# Patient Record
Sex: Female | Born: 1970 | Race: White | Hispanic: No | Smoking: Former smoker
Health system: Southern US, Community
[De-identification: ages and names within clinical notes are randomized; demographics above are authoritative.]

## PROBLEM LIST (undated history)

## (undated) ENCOUNTER — Ambulatory Visit: Admission: EM | Payer: Medicaid Other | Source: Home / Self Care

## (undated) DIAGNOSIS — I10 Essential (primary) hypertension: Secondary | ICD-10-CM

## (undated) DIAGNOSIS — G629 Polyneuropathy, unspecified: Secondary | ICD-10-CM

## (undated) HISTORY — PX: DILATION AND CURETTAGE OF UTERUS: SHX78

## (undated) HISTORY — PX: TONSILLECTOMY AND ADENOIDECTOMY: SUR1326

---

## 2007-08-03 ENCOUNTER — Emergency Department: Payer: Self-pay | Admitting: Internal Medicine

## 2007-10-06 IMAGING — CR DG CHEST 2V
1 series · 3 of 3 positions shown · non-contrast
Comparison: none

REASON FOR EXAM: [DATE]--smoker
COMMENTS:

PROCEDURE:     DXR - DXR CHEST PA (OR AP) AND LATERAL  - [DATE]  [DATE]
RESULT:     The lung fields are clear. No pneumonia, pneumothorax or pleural
effusion is seen. The heart size is normal. The mediastinal and osseous
structures show no significant abnormalities.

[Series 1: view not recorded · 0.17mm/px · 3 of 3 slices shown]
[im 1/3]
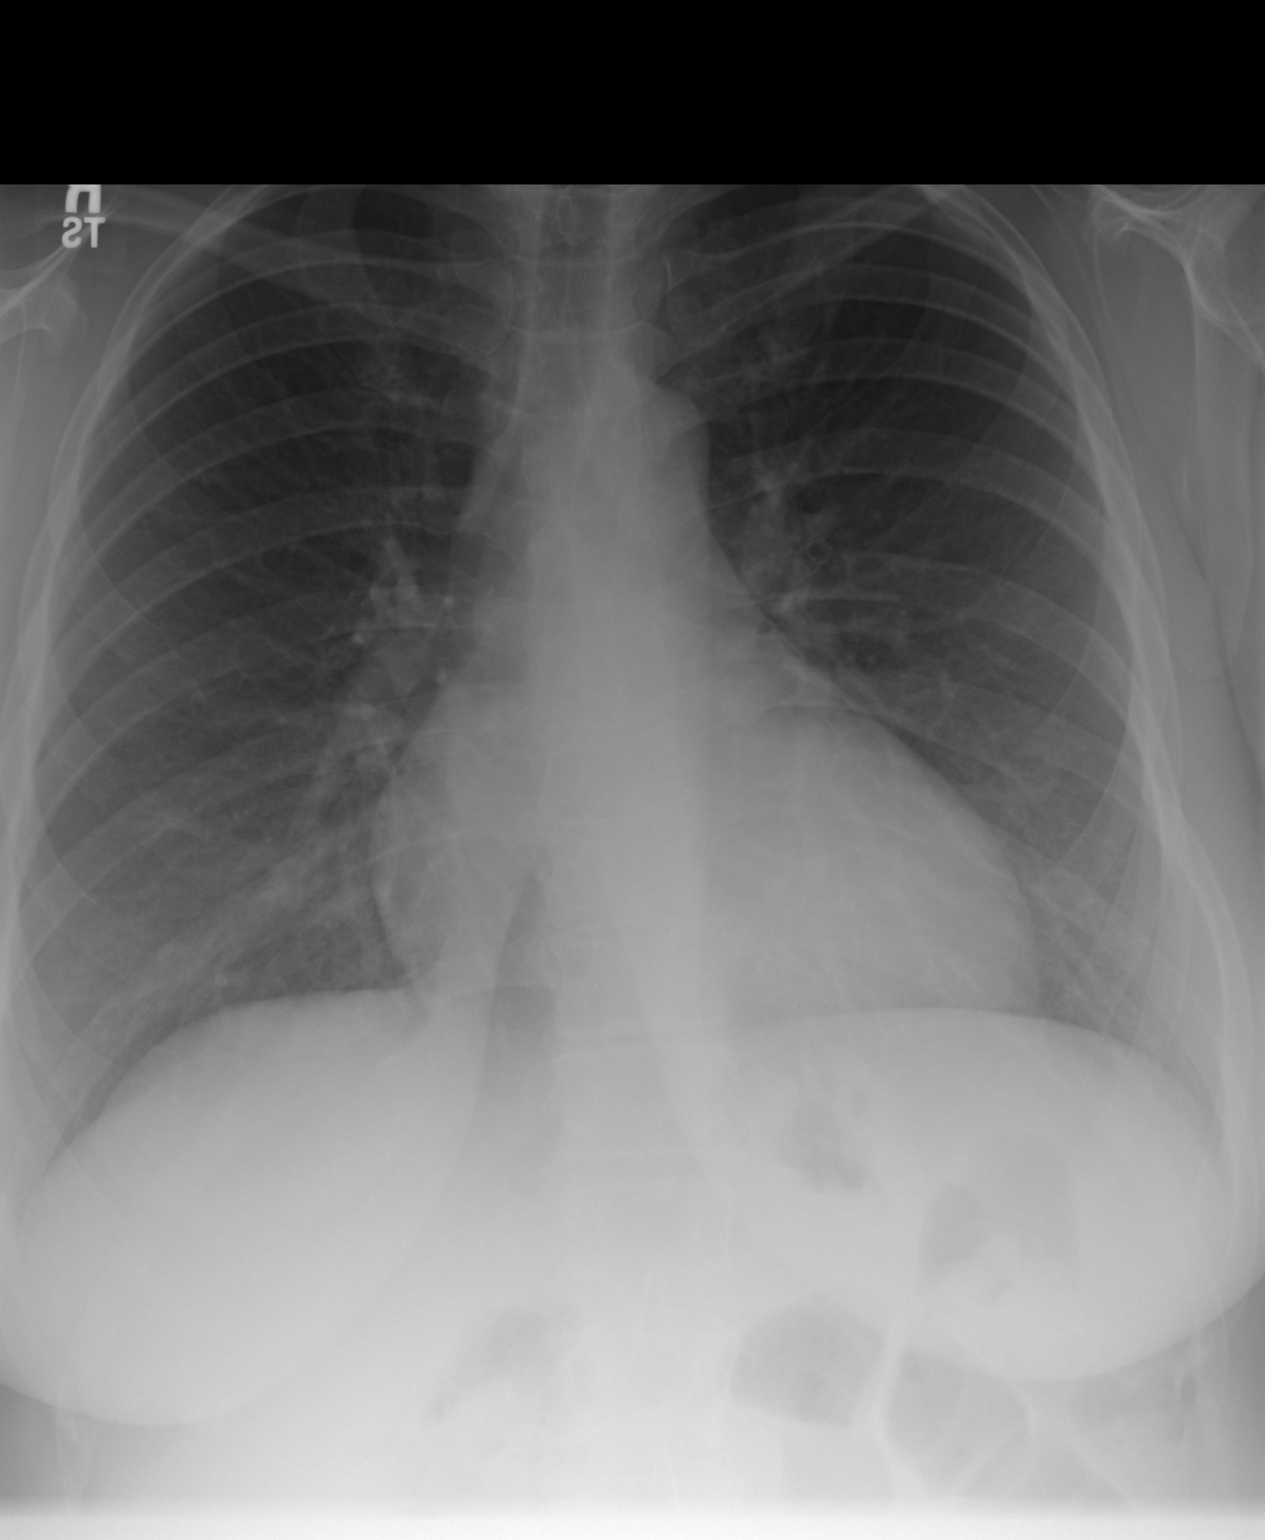
[im 2/3]
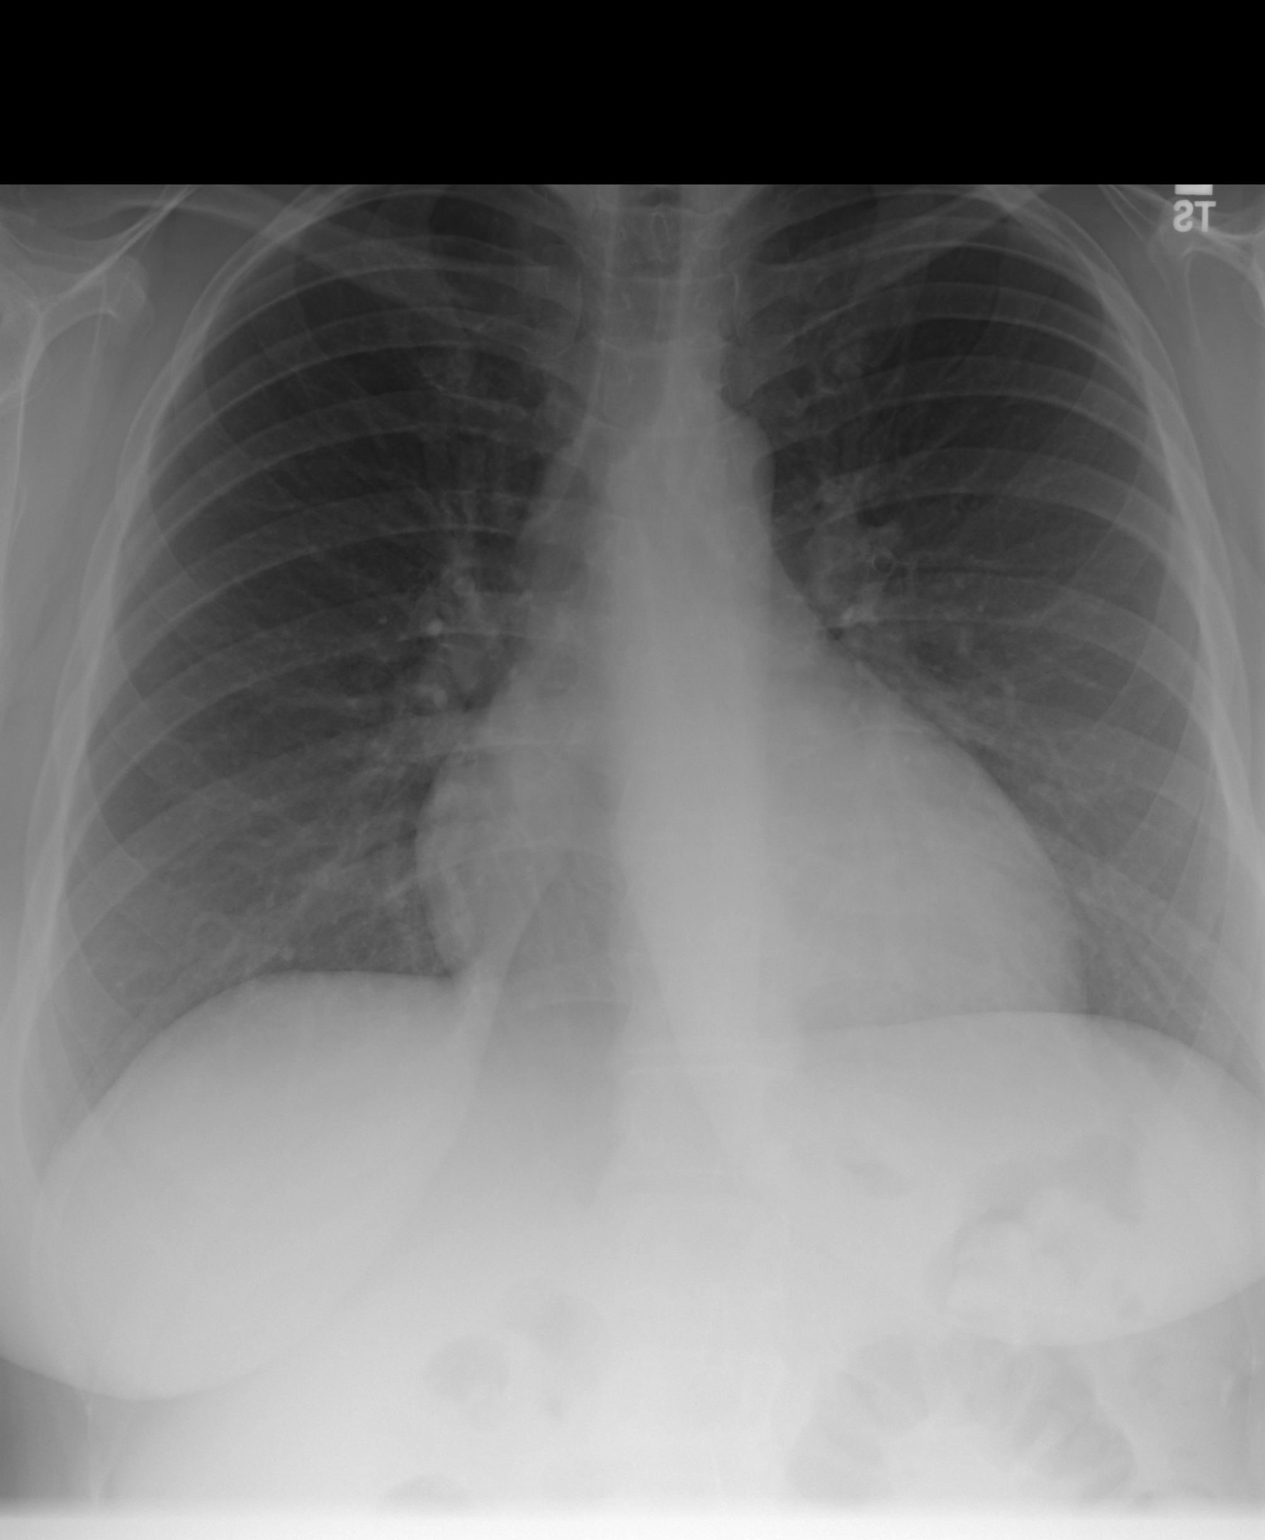
[im 3/3]
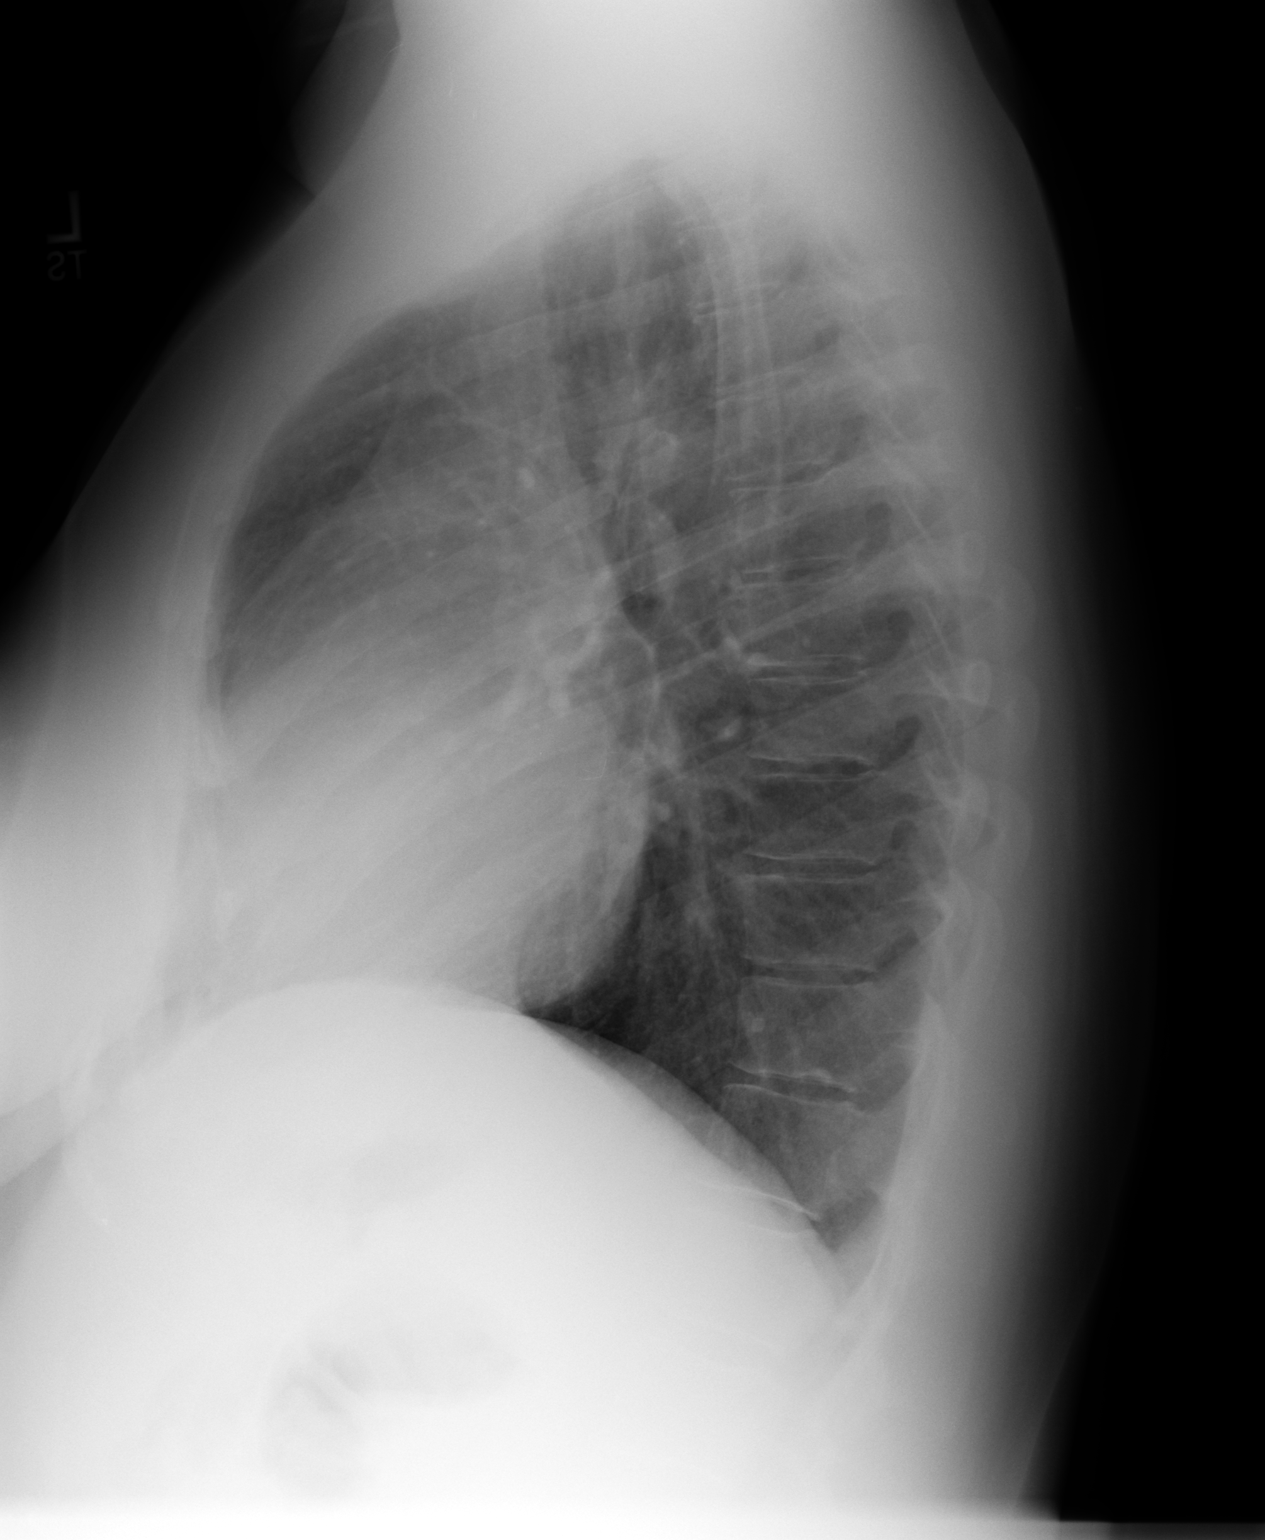

[3 of 3 positions shown; findings below may reference images not displayed]

IMPRESSION: No significant abnormalities are noted.

## 2007-10-23 ENCOUNTER — Ambulatory Visit: Payer: Self-pay | Admitting: Obstetrics and Gynecology

## 2007-10-25 ENCOUNTER — Ambulatory Visit: Payer: Self-pay | Admitting: Obstetrics and Gynecology

## 2008-04-27 ENCOUNTER — Emergency Department: Payer: Self-pay

## 2008-04-27 IMAGING — CR DG WRIST COMPLETE 3+V*R*
1 series · 4 of 4 positions shown · non-contrast
Comparison: none

REASON FOR EXAM: injury
COMMENTS:

PROCEDURE:     DXR - DXR WRIST RT COMP WITH OBLIQUES  - [DATE] [DATE]
RESULT:     No fracture, dislocation or other acute bony abnormality is
identified.

[Series 1: view not recorded · 0.17mm/px · 4 of 4 slices shown]
[im 1/4]
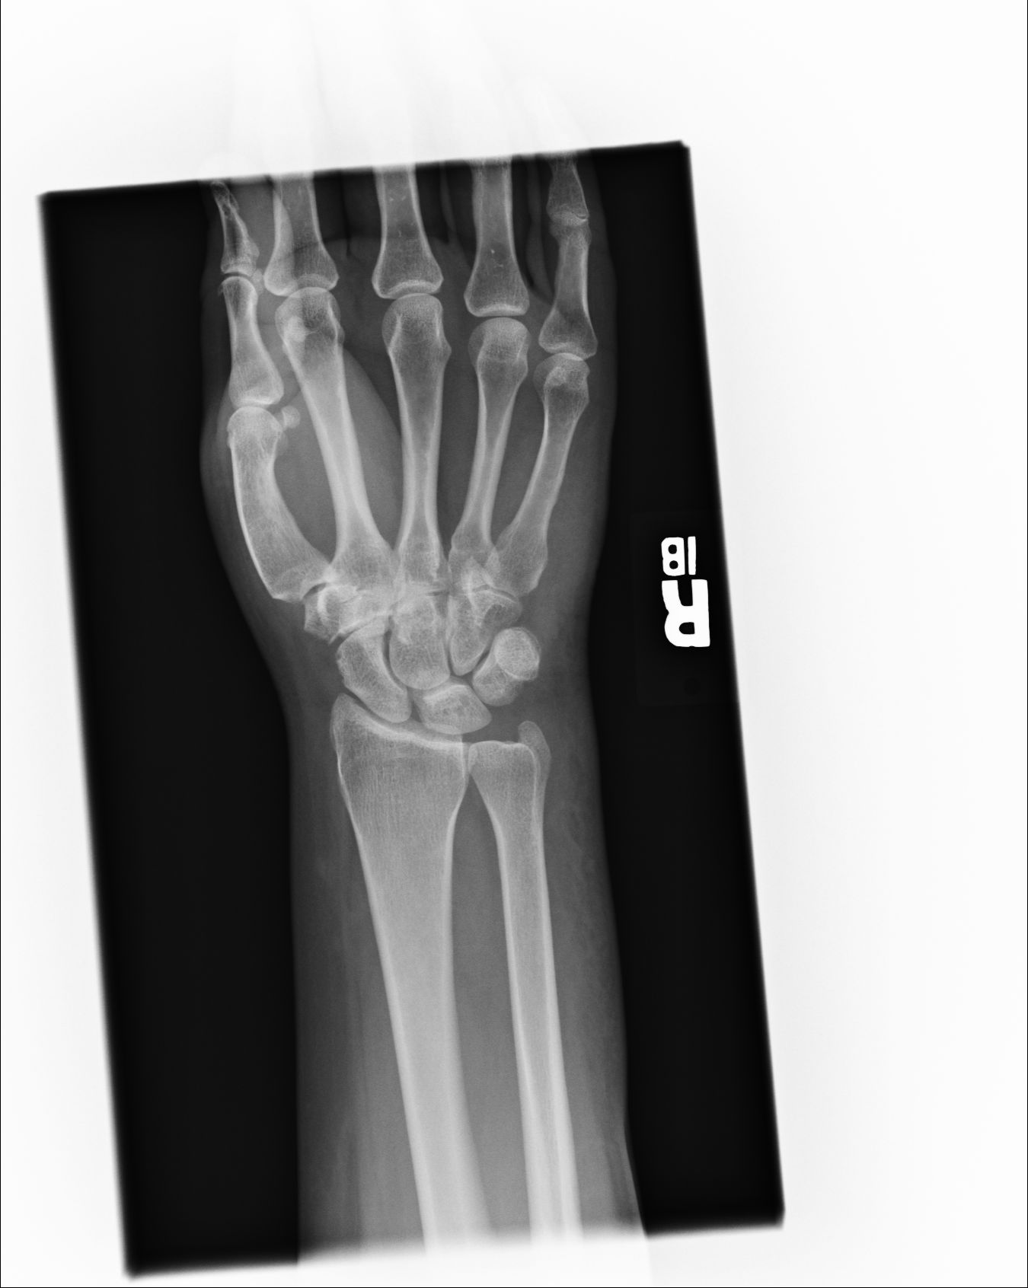
[im 2/4]
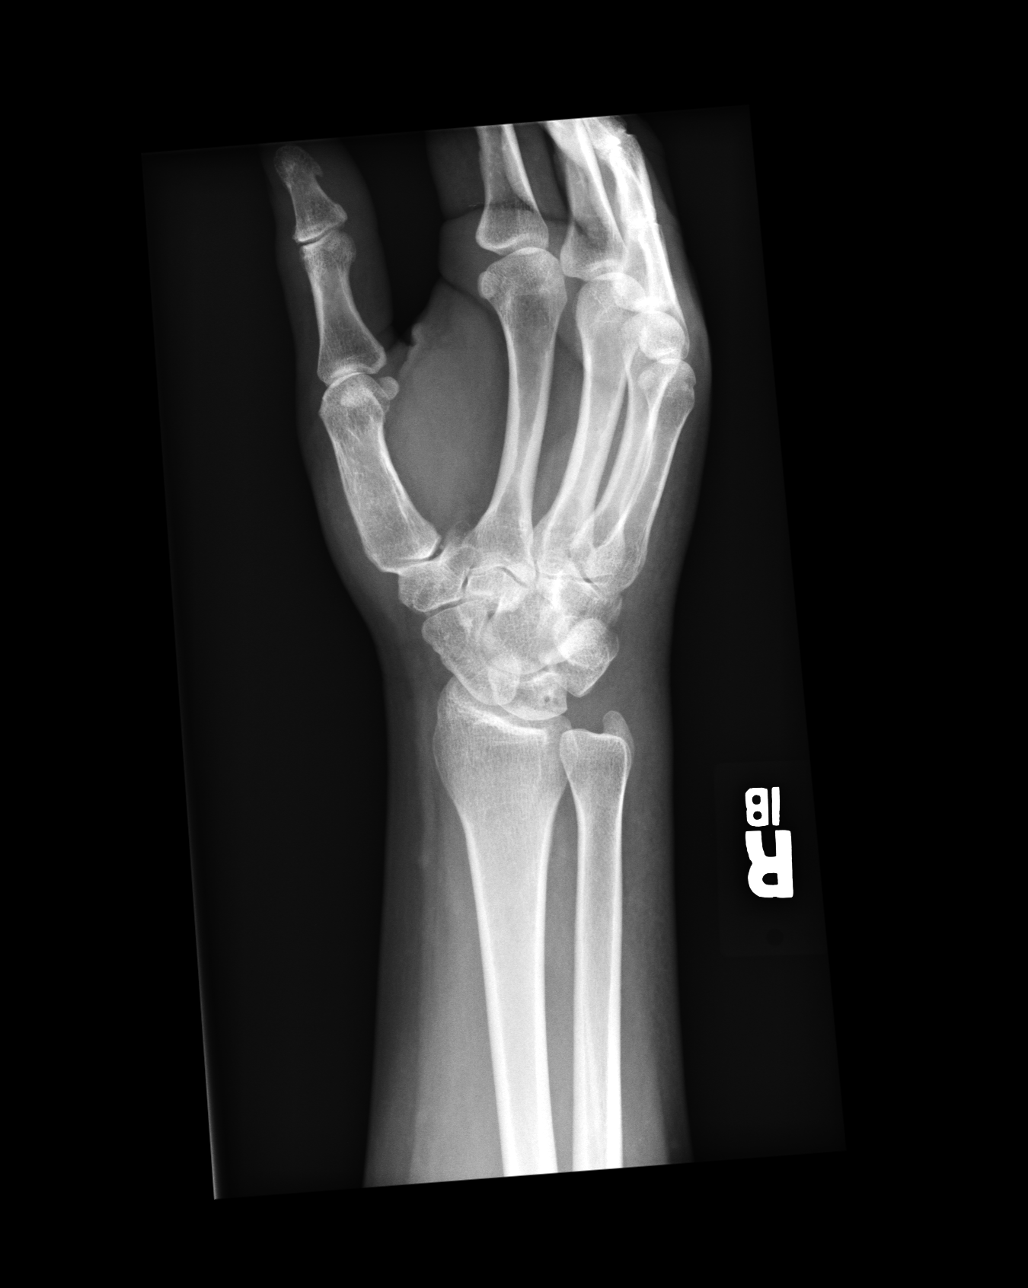
[im 3/4]
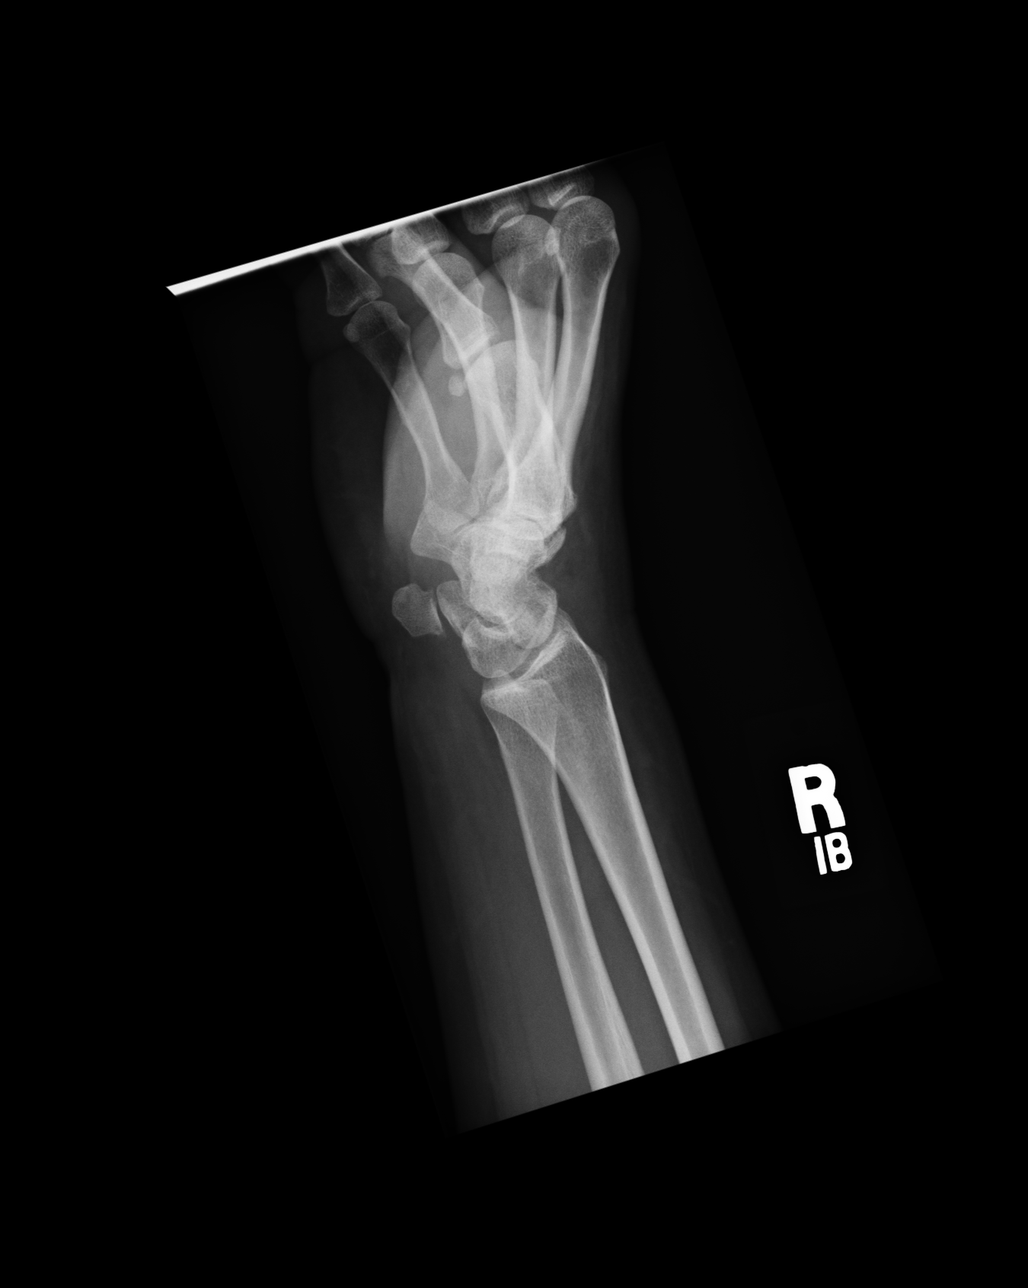
[im 4/4]
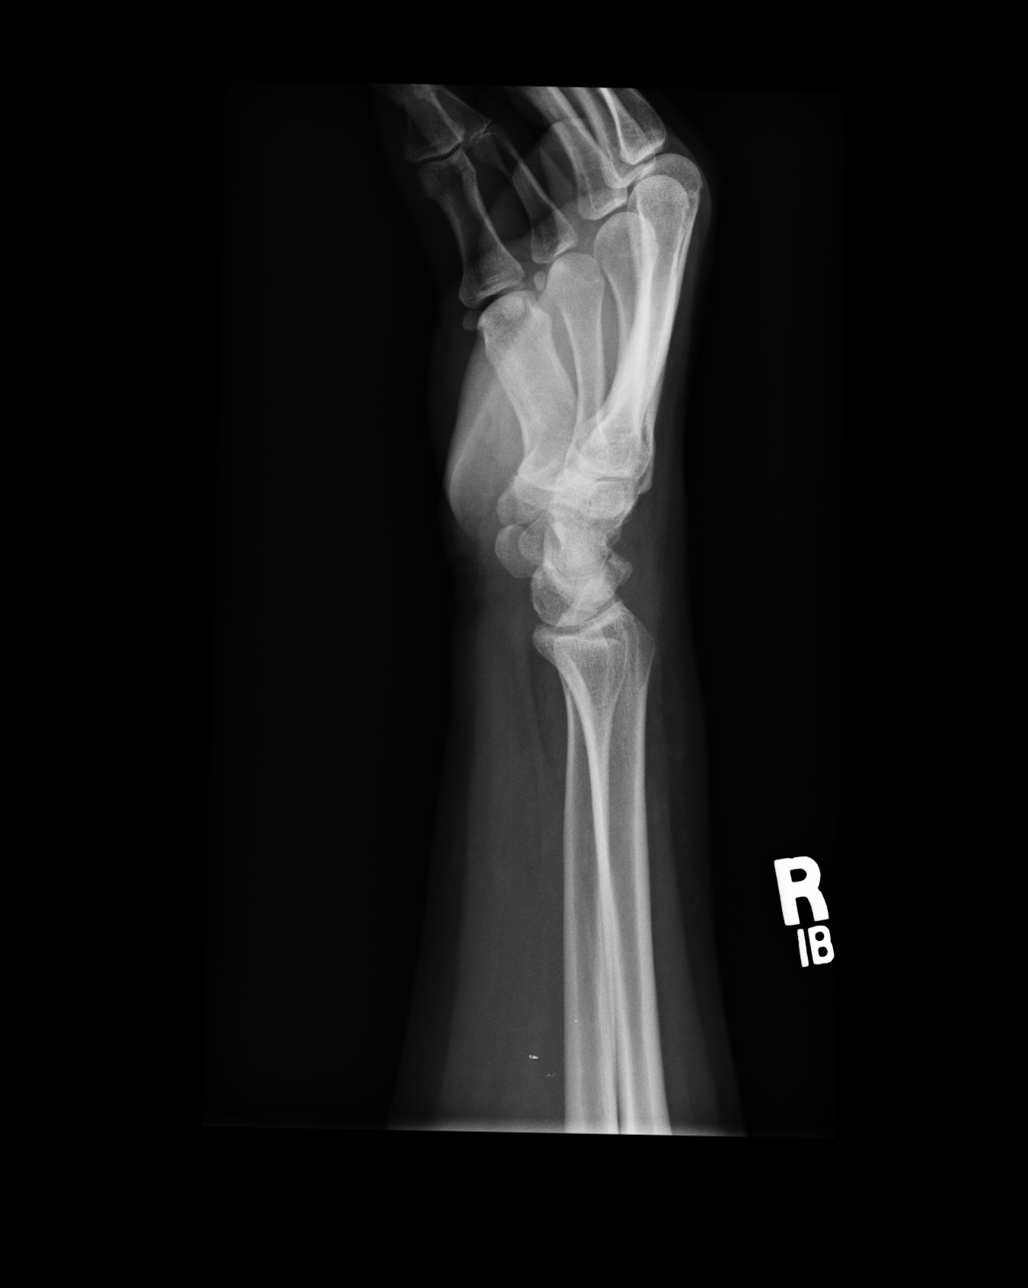

[4 of 4 positions shown; findings below may reference images not displayed]

IMPRESSION: No significant osseous abnormalities are identified.

## 2009-04-27 ENCOUNTER — Emergency Department: Payer: Self-pay | Admitting: Emergency Medicine

## 2009-04-27 IMAGING — CT CT STONE STUDY
1 of 2 series · 15 of 32 positions shown, 19 images · non-contrast
Comparison: none

REASON FOR EXAM: right flank pain with hist of kidney stones
COMMENTS:   LMP: Three weeks ago

[Series 2: stone · axial · 0.85mm/px · z∈[-544,-130]mm · 15 of 150 slices shown, 19 images]
[im 6/150  soft-tissue]
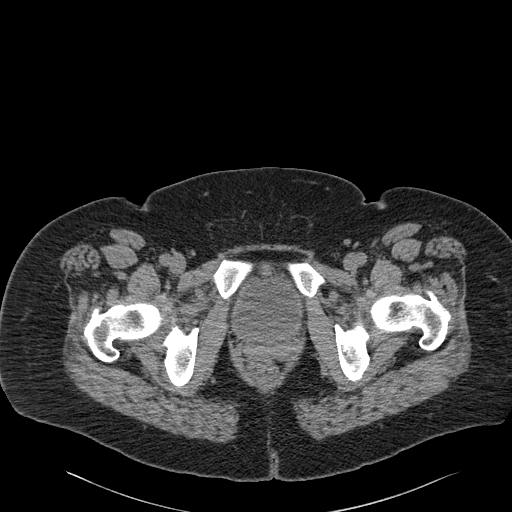
[im 6/150  bone]
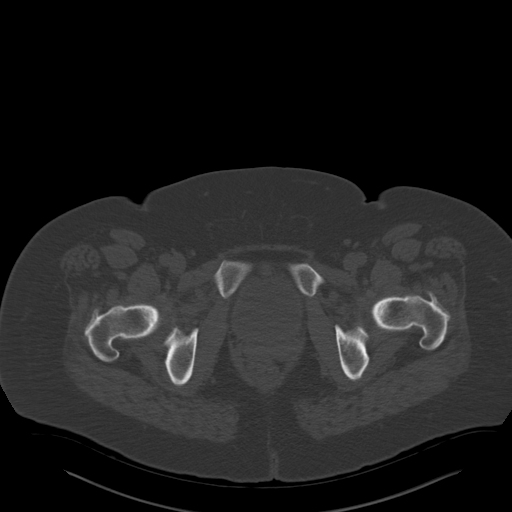
[im 18/150  soft-tissue]
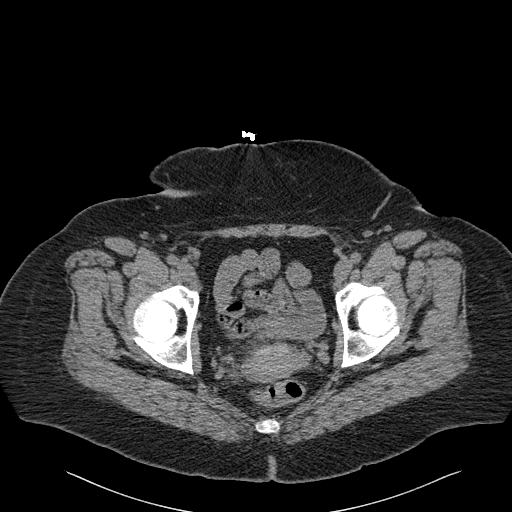
[im 29/150  soft-tissue]
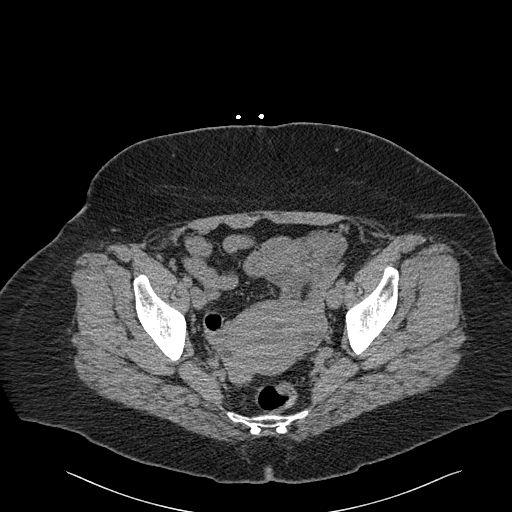
[im 41/150  soft-tissue]
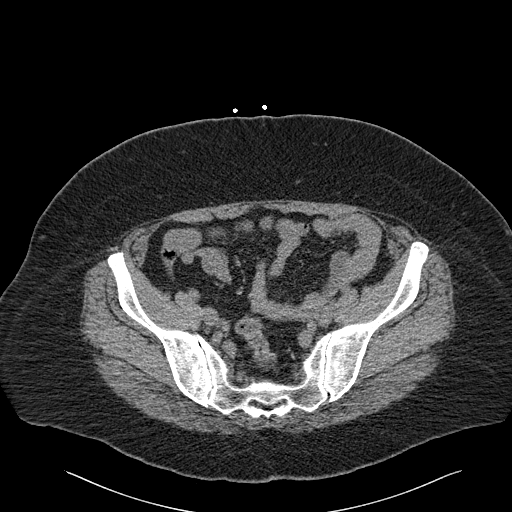
[im 52/150  soft-tissue]
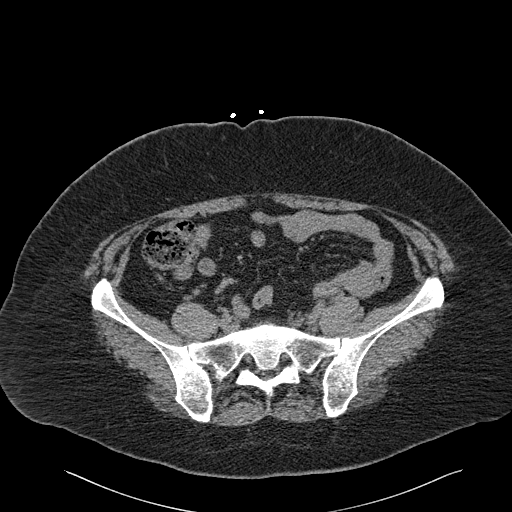
[im 64/150  soft-tissue]
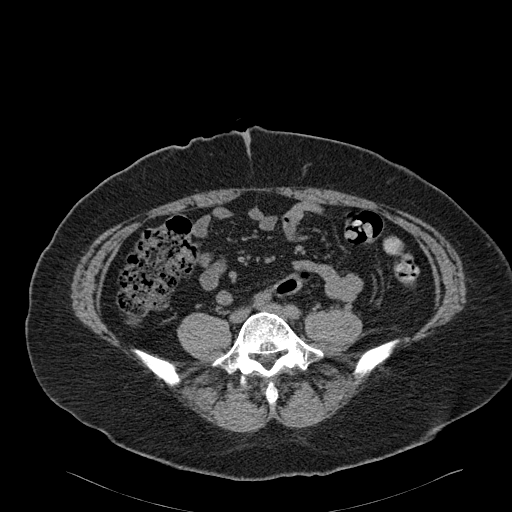
[im 75/150  soft-tissue]
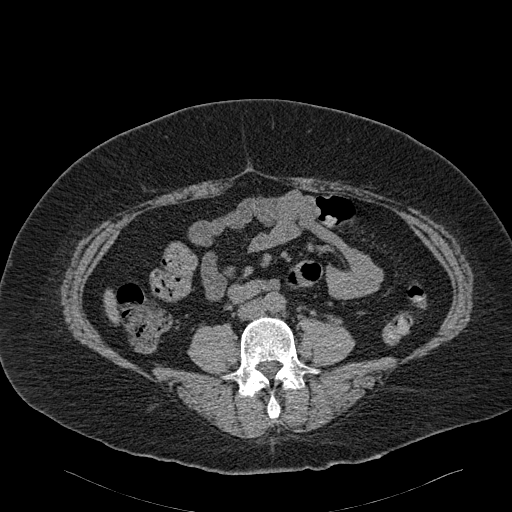
[im 86/150  soft-tissue]
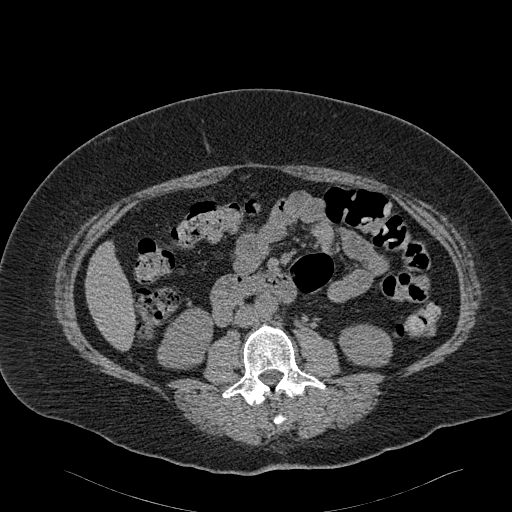
[im 98/150  soft-tissue]
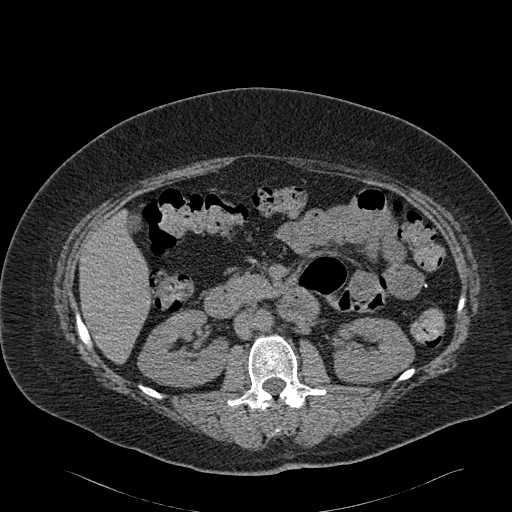
[im 98/150  bone]
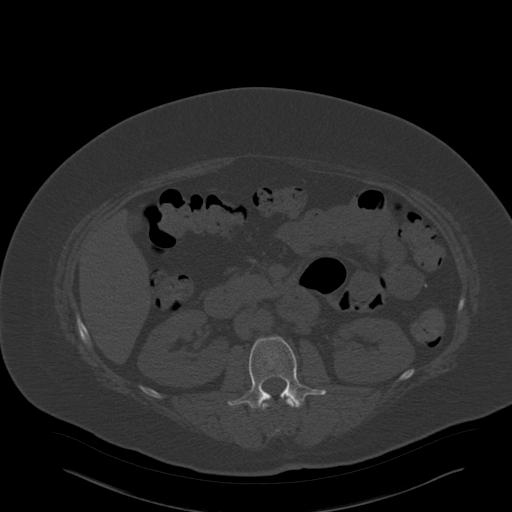
[im 109/150  soft-tissue]
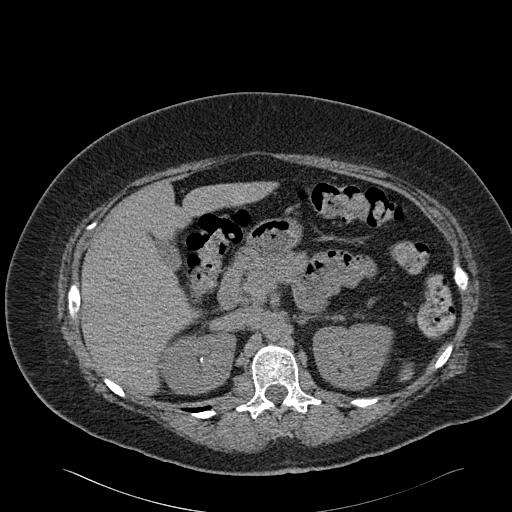
[im 121/150  soft-tissue]
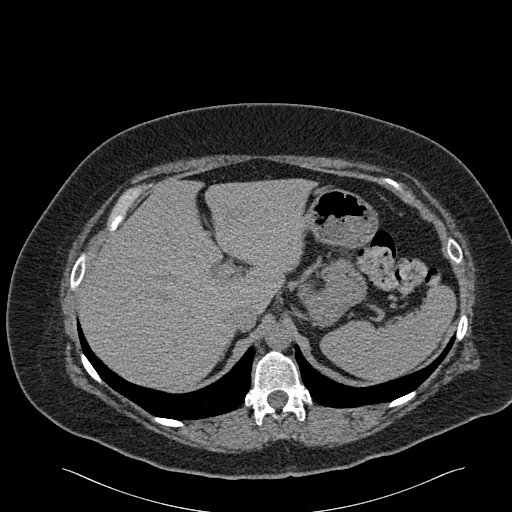
[im 127/150  lung]
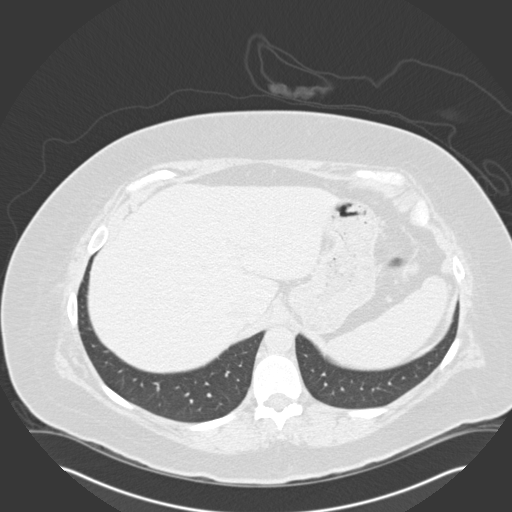
[im 132/150  soft-tissue]
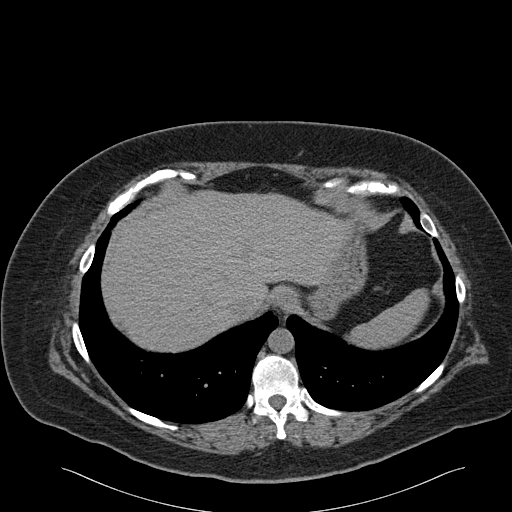
[im 132/150  lung]
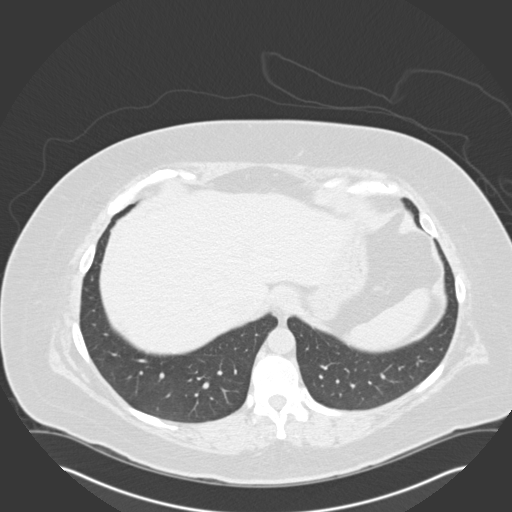
[im 138/150  lung]
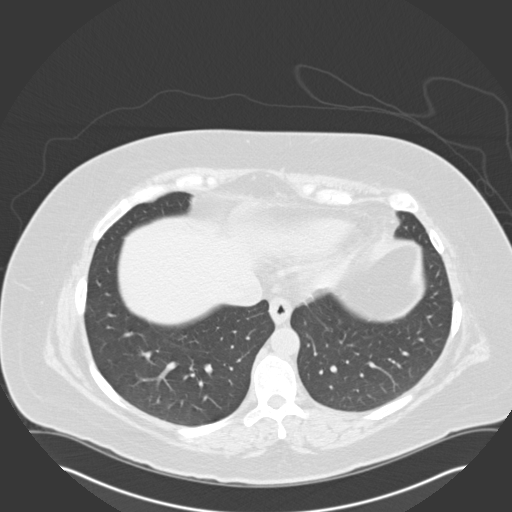
[im 144/150  soft-tissue]
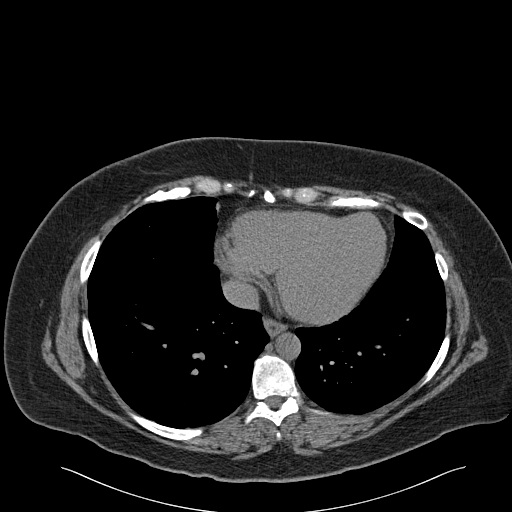
[im 144/150  lung]
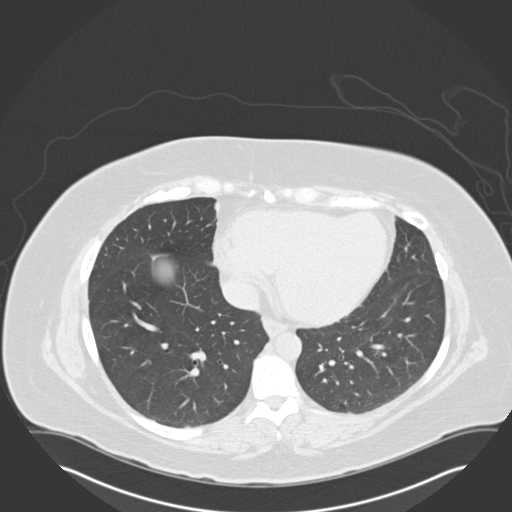

[15 of 32 positions shown; findings below may reference images not displayed]

PROCEDURE:     CT  - CT ABDOMEN /PELVIS WO (STONE)  - [DATE]  [DATE]

RESULT:     Axial noncontrast CT scanning was performed through the abdomen
and pelvis at 3 mm intervals and slice thicknesses. Review of 3-dimensional
reconstructed images was performed separately on the webspace server monitor.

There are stones present in both kidneys. There is no evidence of
obstruction however. The perinephric fat is normal in density. Along the
course of the ureters I do not see evidence of calcified stones. The
partially distended urinary bladder is normal in appearance.

The liver, gallbladder, pancreas, spleen, nondistended stomach, adrenal
glands, and periaortic and pericaval regions are normal in appearance. The
unopacified loops of small and large bowel exhibit no acute abnormality. The
appendix is demonstrated and appears normal. The uterus and adnexal regions
exhibit no acute abnormality. I cannot exclude small cystic ovarian
processes. There is no free fluid in the pelvis. There is no evidence of an
inguinal nor umbilical hernia. The lung bases are clear. The lumbar
vertebral bodies are preserved in height. There is disc space narrowing at
the L3-L4 disc level.
IMPRESSION: 1. There are multiple calcified stones in both kidneys. I do not see
evidence of obstruction currently. There is no evidence of perinephric
inflammatory change. No urinary bladder stones are identified.
2. The appendix is identified and appears normal. The unopacified loops of
small and large bowel exhibit no evidence of obstruction.
3. I do not see evidence of acute hepatobiliary abnormality.
4. There are hypodensities associated with the adnexal regions which likely
reflect small cystic ovarian processes. There is no free fluid in the pelvis.

## 2009-10-12 ENCOUNTER — Ambulatory Visit: Payer: Self-pay | Admitting: Unknown Physician Specialty

## 2009-10-12 IMAGING — US TRANSABDOMINAL ULTRASOUND OF PELVIS
1 series · 17 of 25 positions shown · non-contrast
Comparison: none

REASON FOR EXAM: irregular menses
COMMENTS:

[Series 1: transabdominal ultrasound of pelvis · 17 of 54 slices shown]
[im 1/54]
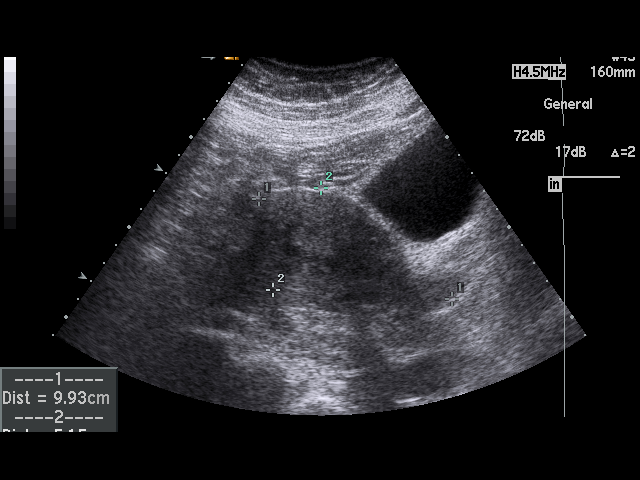
[im 5/54]
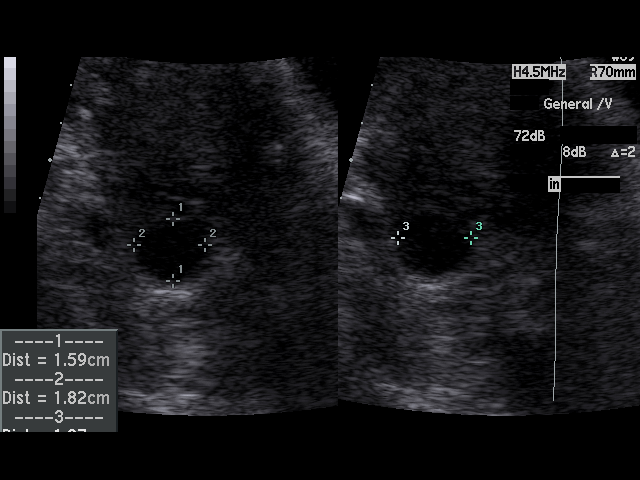
[im 7/54]
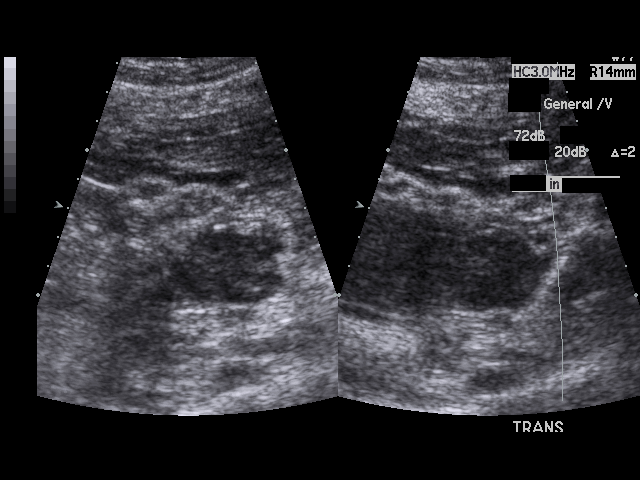
[im 12/54]
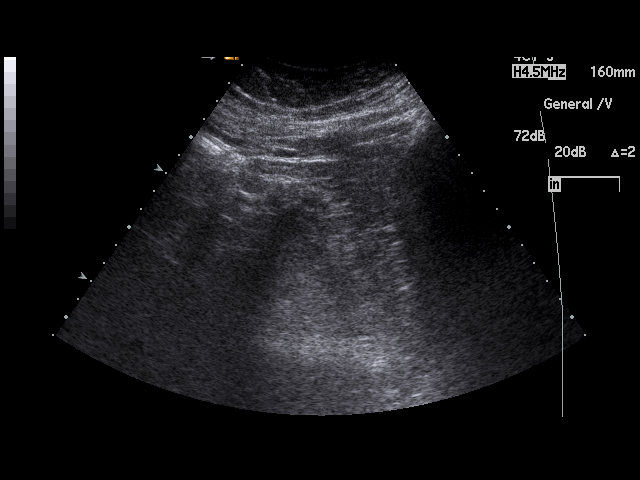
[im 14/54]
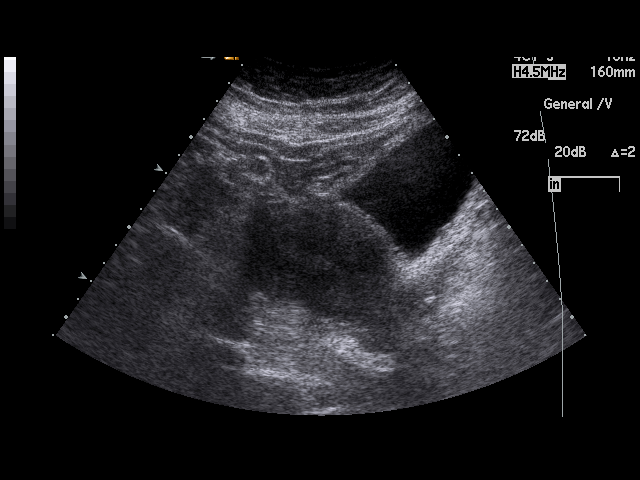
[im 18/54]
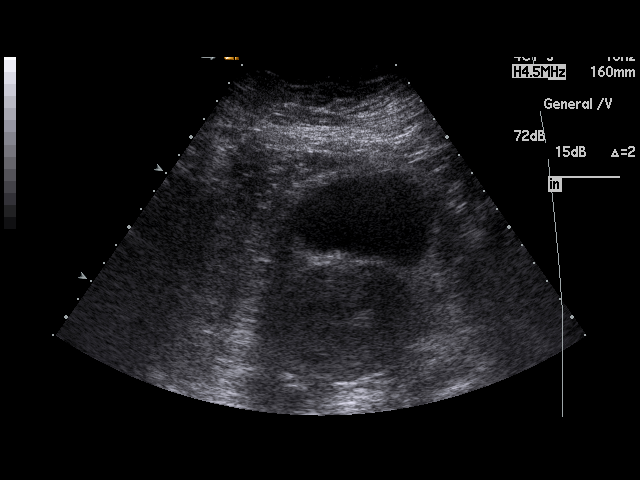
[im 20/54]
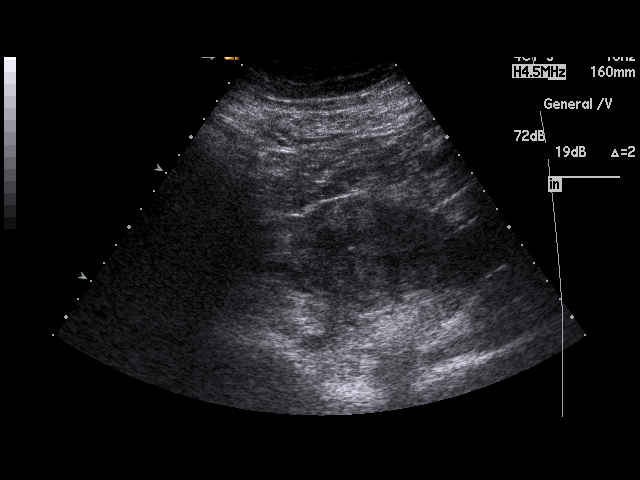
[im 25/54]
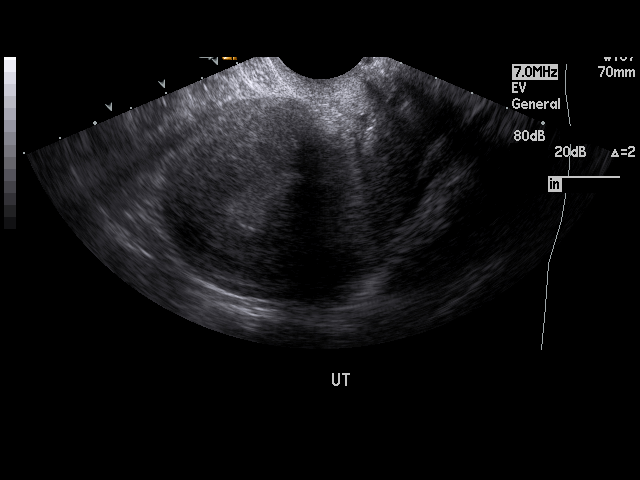
[im 27/54]
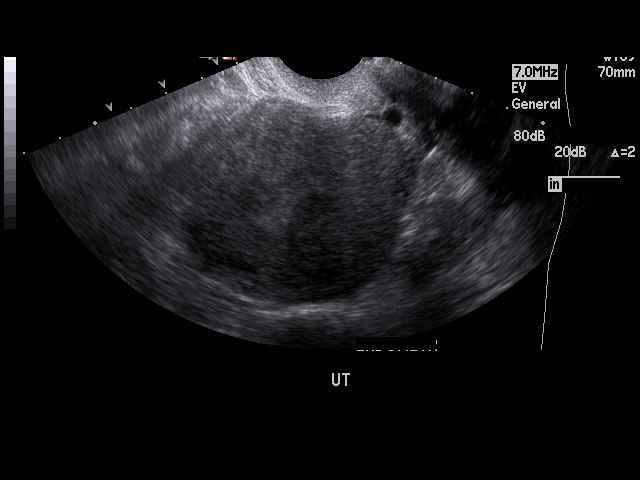
[im 29/54]
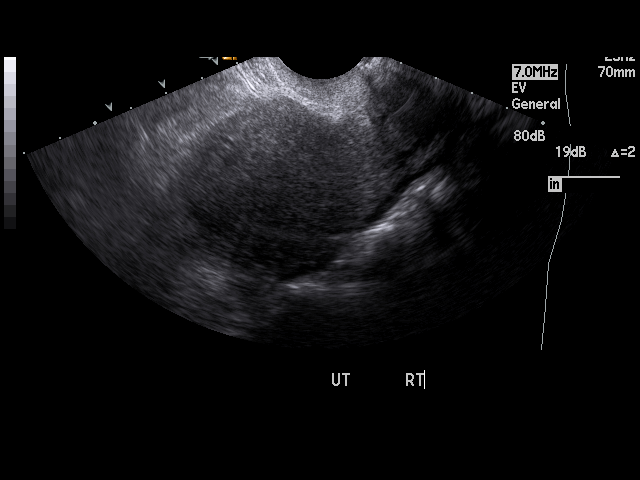
[im 34/54]
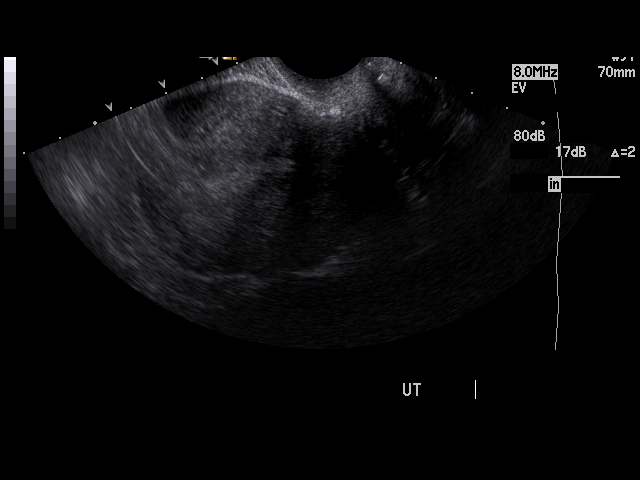
[im 36/54]
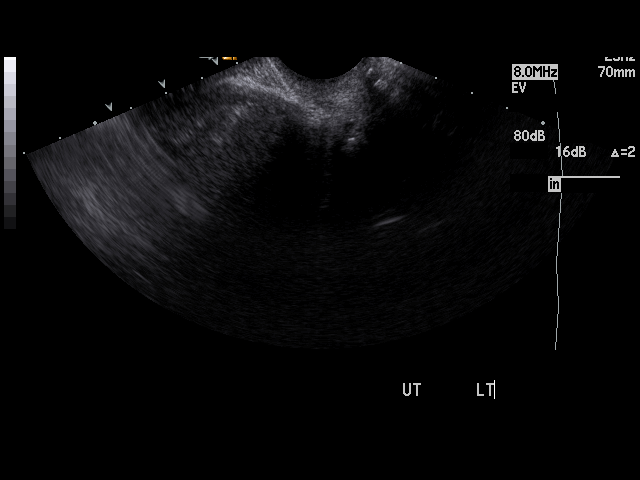
[im 40/54]
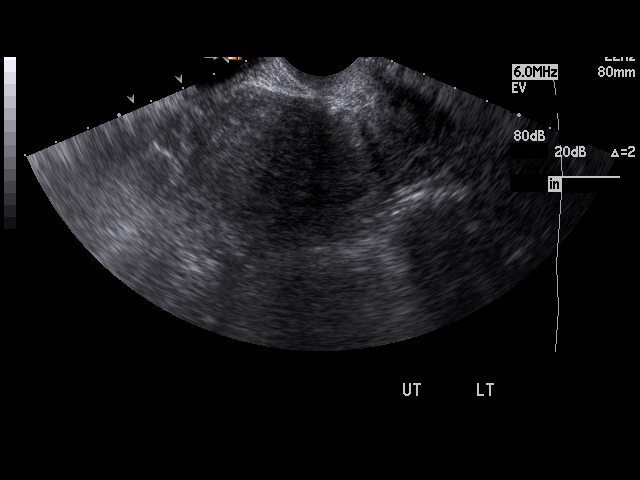
[im 42/54]
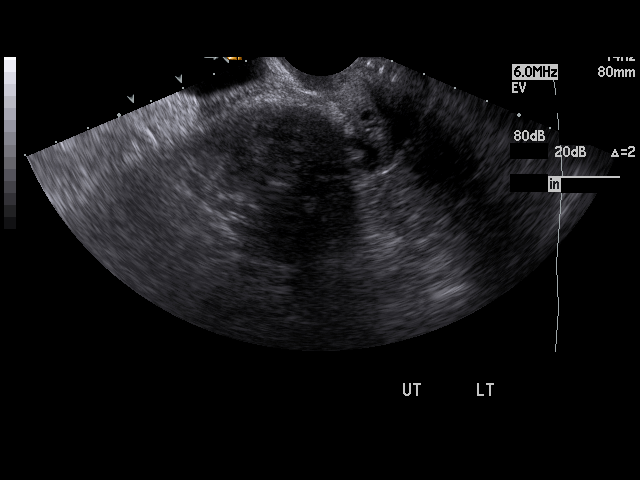
[im 47/54]
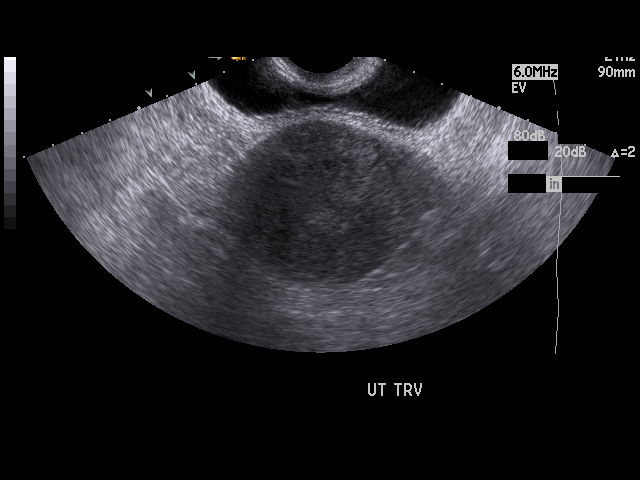
[im 49/54]
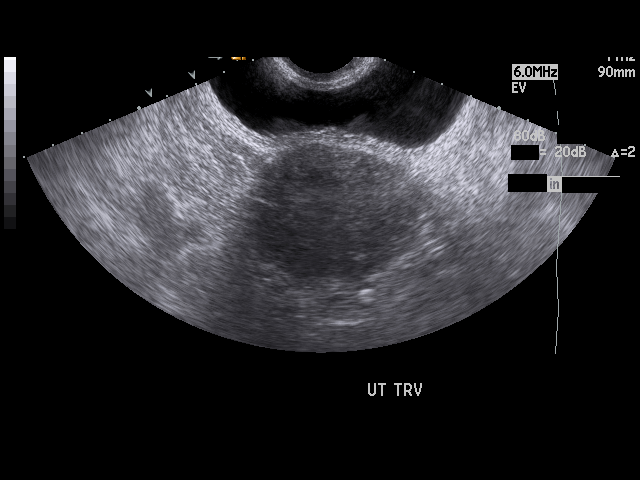
[im 54/54]
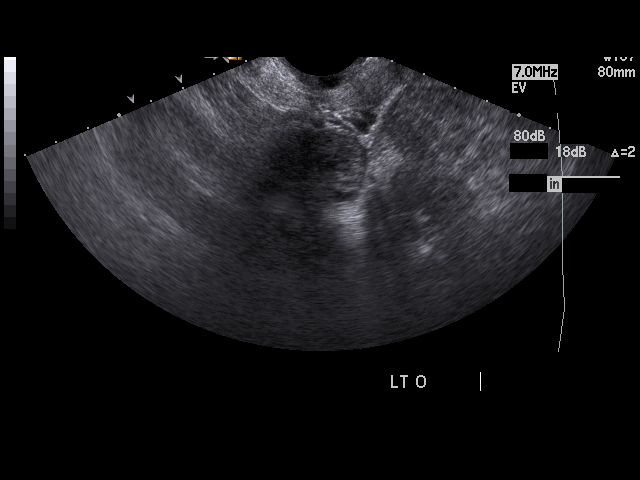

[17 of 25 positions shown; findings below may reference images not displayed]

PROCEDURE:     [facility] [place] PELVIS NON-OB W/TRANSVAGINAL  - [DATE]  [DATE]

RESULT:     Transabdominal and endovaginal scanning demonstrates the uterus
measures 9.9 x 5.4 x 5.1 cm. The right ovary measures 3.8 x 2.3 x 3.6 cm and
contains a dominant follicle or cyst measuring as much as 1.8 x 1.6 x
cm. The left ovary measures 3.7 x 3.2 x 2.5 cm and does not show evidence of
a solid or cystic mass or abnormal calcifications. The urinary bladder is
unremarkable. The kidneys on survey images show no gross abnormality.
Endometrial stripe thickness is measured at 8.8 mm.
IMPRESSION: Right ovarian follicle or small cyst as described. No free fluid or other
abnormal fluid collection. The study is otherwise unremarkable.

## 2010-06-28 ENCOUNTER — Ambulatory Visit: Payer: Self-pay | Admitting: Internal Medicine

## 2010-06-28 IMAGING — CR RIGHT HIP - COMPLETE 2+ VIEW
1 series · 3 of 3 positions shown · non-contrast
Comparison: none

REASON FOR EXAM: PAIN
COMMENTS:

PROCEDURE:     MDR - MDR HIP RIGHT COMPLETE  - [DATE]  [DATE]
RESULT:     AP and lateral views of the right hip reveal the bones to be
adequately mineralized. I do not see evidence of an acute fracture nor
dislocation nor significant degenerative change.

[Series 1: view not recorded · 0.17mm/px · 3 of 3 slices shown]
[im 1/3]
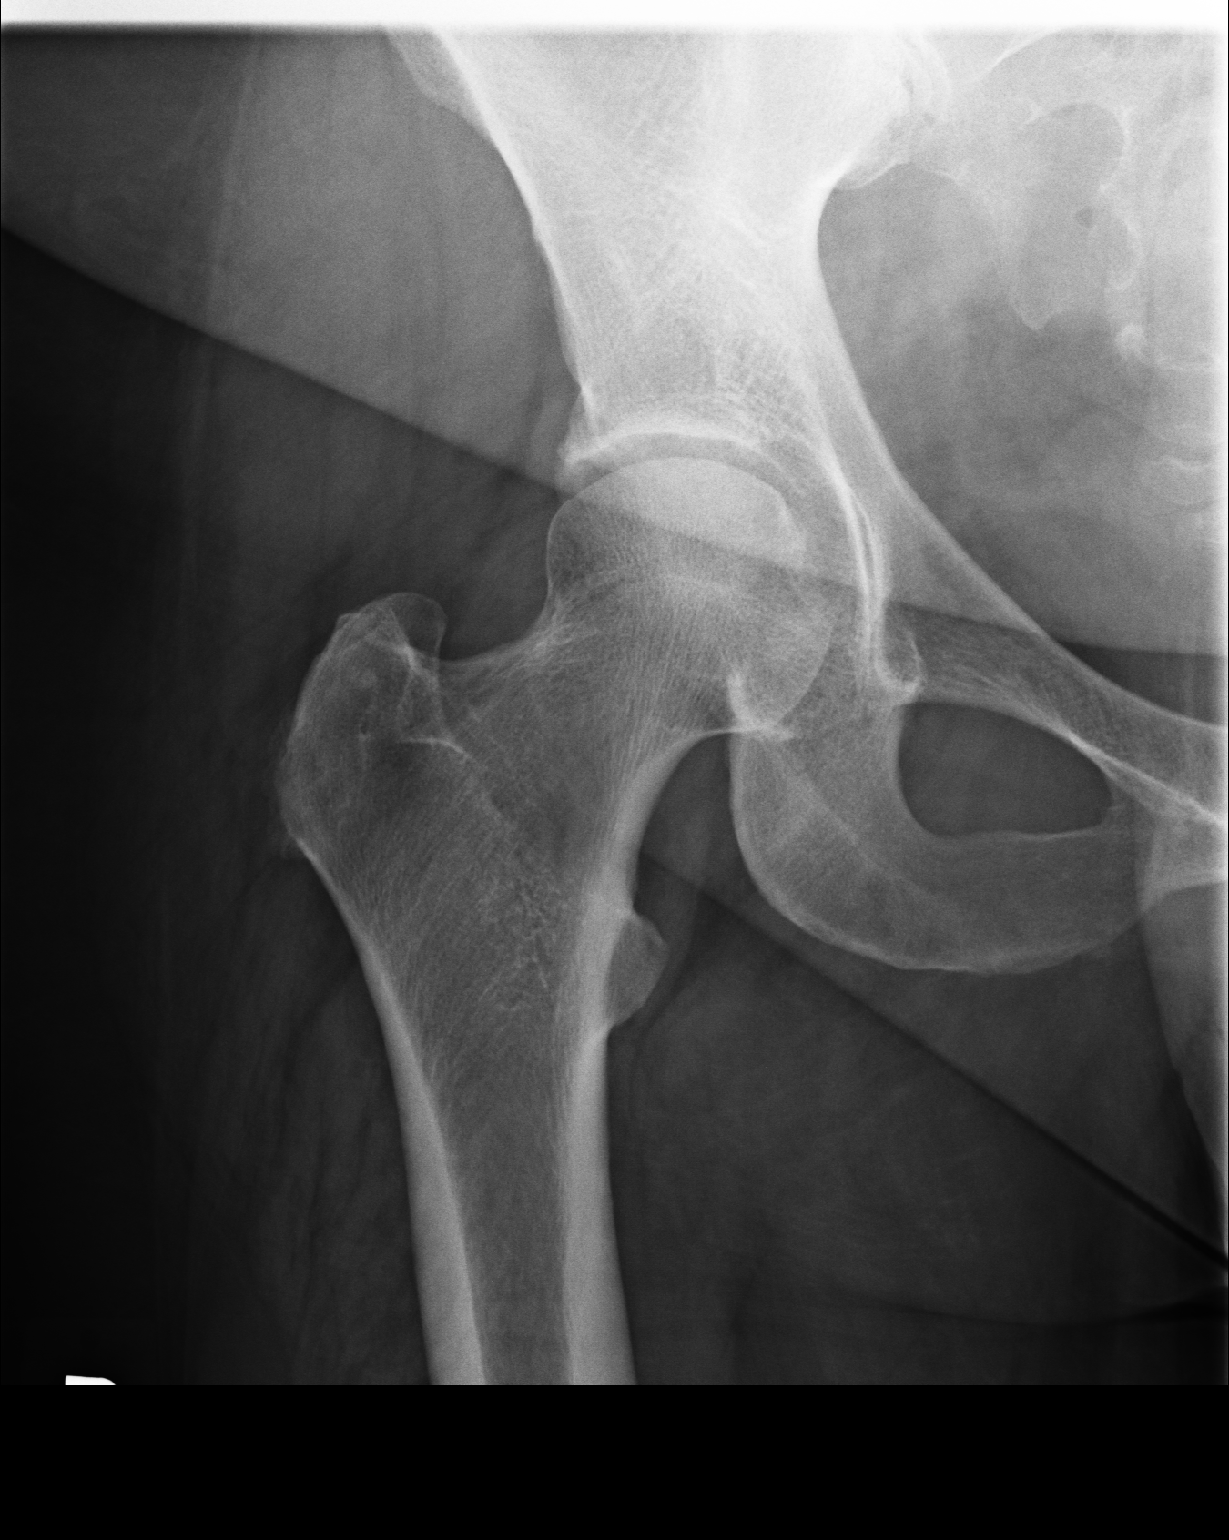
[im 2/3]
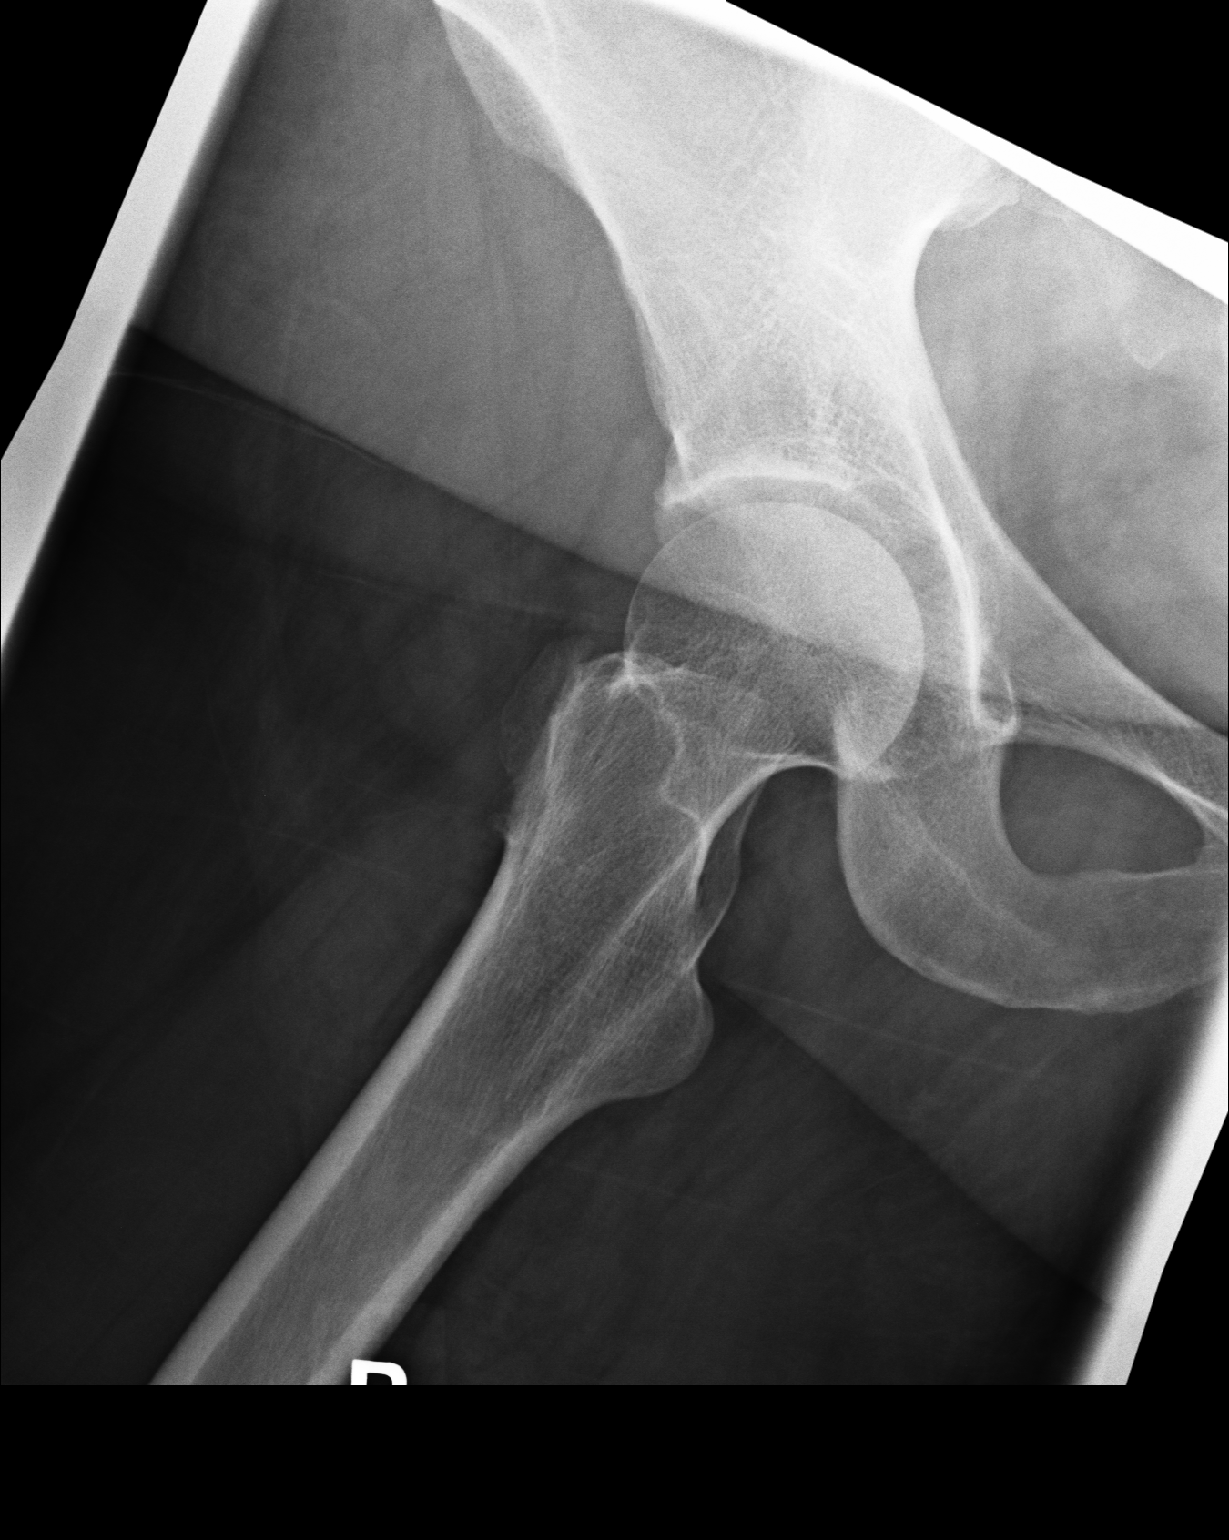
[im 3/3]
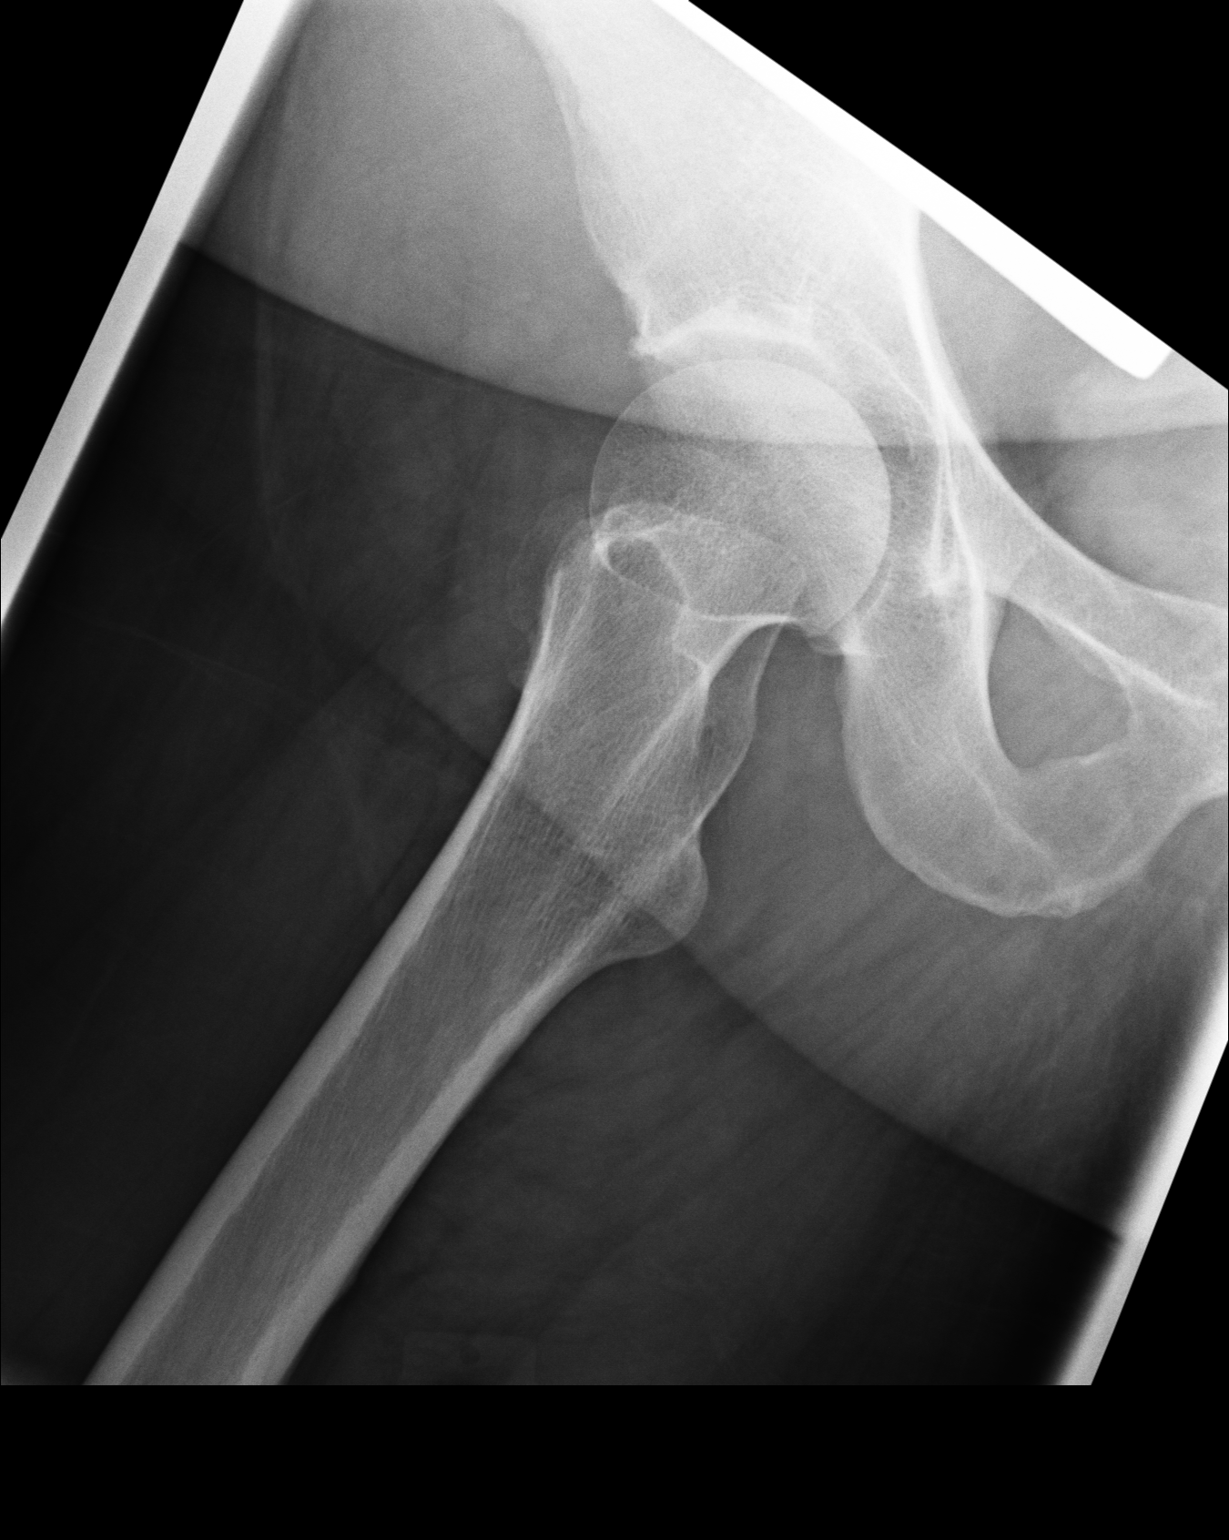

[3 of 3 positions shown; findings below may reference images not displayed]

IMPRESSION: I see no acute bony abnormality of the right hip.

If the patient's symptoms persist and remain unexplained, followup MRI may
be useful in an effort to detect avascular necrosis or other occult
pathology.

## 2010-09-27 ENCOUNTER — Emergency Department: Payer: Self-pay | Admitting: Unknown Physician Specialty

## 2010-09-27 ENCOUNTER — Ambulatory Visit: Payer: Self-pay | Admitting: Urology

## 2010-09-27 IMAGING — CT CT ABD-PELV W/ CM
1 of 2 series · 15 of 32 positions shown, 19 images · non-contrast
Comparison: none

REASON FOR EXAM: (1) R & LLQ pain with vomiting; (2) R & LLQ pain with
vomiting
COMMENTS:   LMP: Negative Beta HCG

[Series 2: 3mm soft tissue · axial · 0.98mm/px · z∈[-575,-80]mm · 15 of 181 slices shown, 19 images]
[im 8/181  soft-tissue]
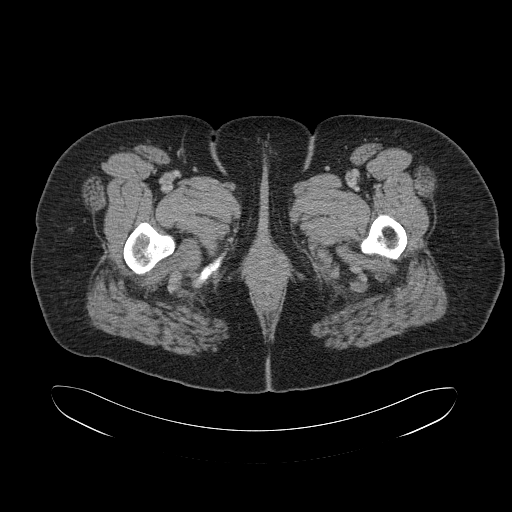
[im 8/181  bone]
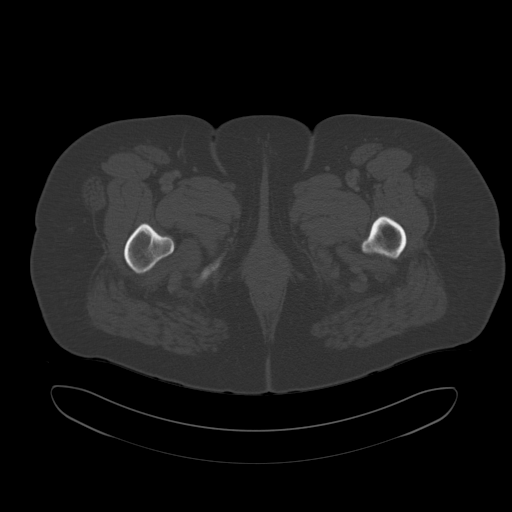
[im 23/181  soft-tissue]
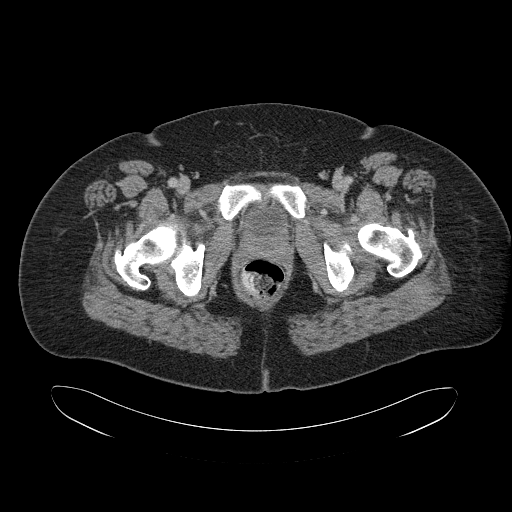
[im 38/181  soft-tissue]
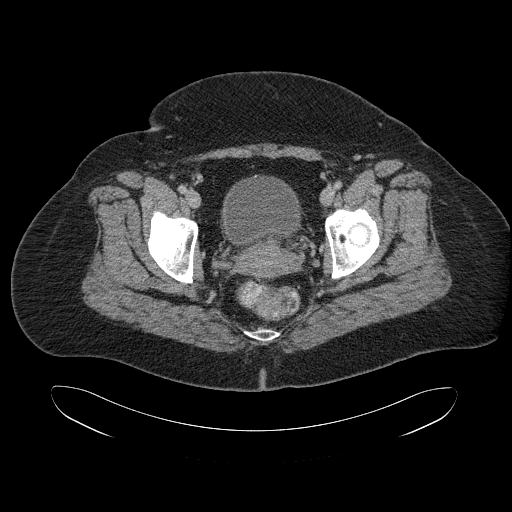
[im 53/181  soft-tissue]
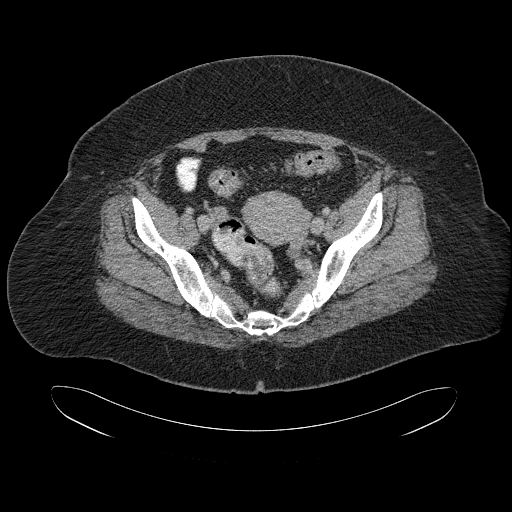
[im 61/181  soft-tissue]
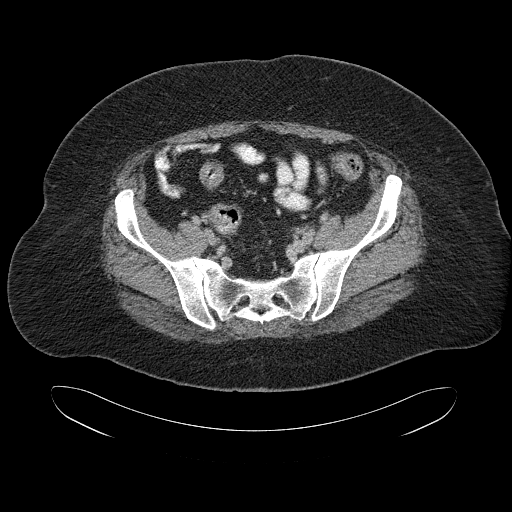
[im 76/181  soft-tissue]
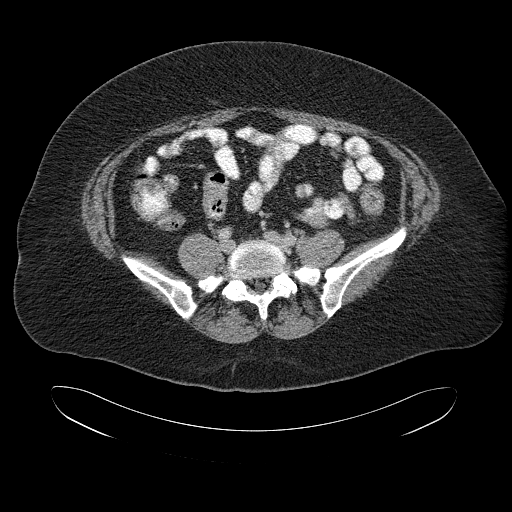
[im 91/181  soft-tissue]
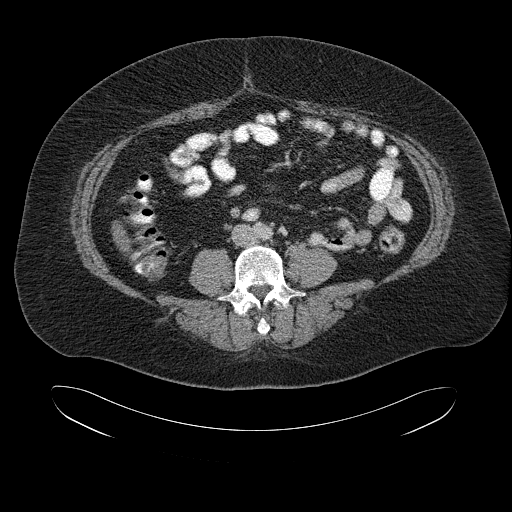
[im 106/181  soft-tissue]
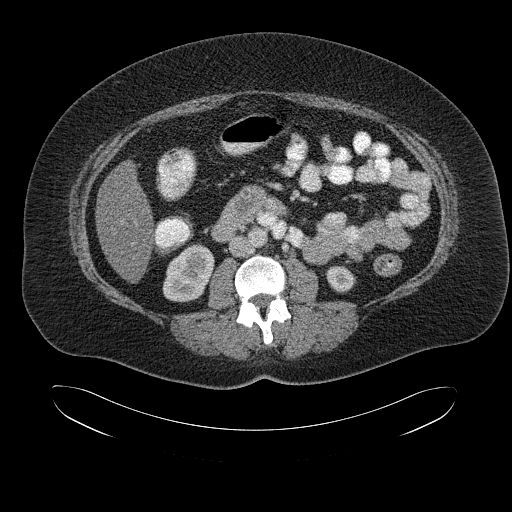
[im 121/181  soft-tissue]
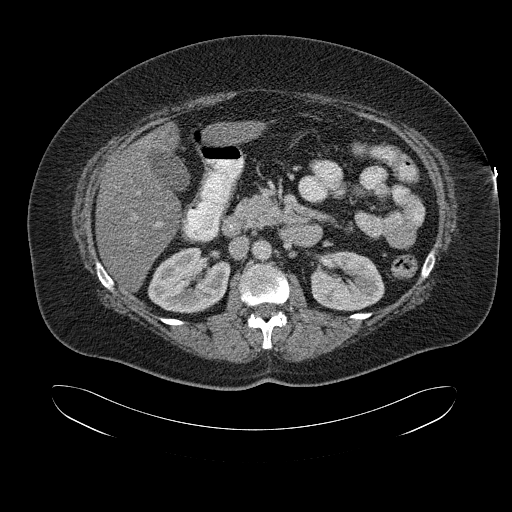
[im 121/181  bone]
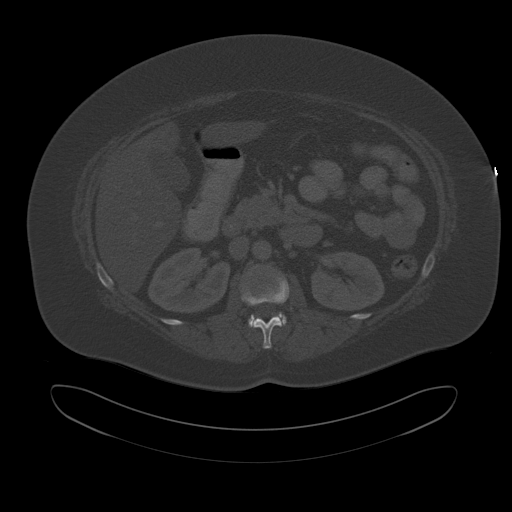
[im 128/181  soft-tissue]
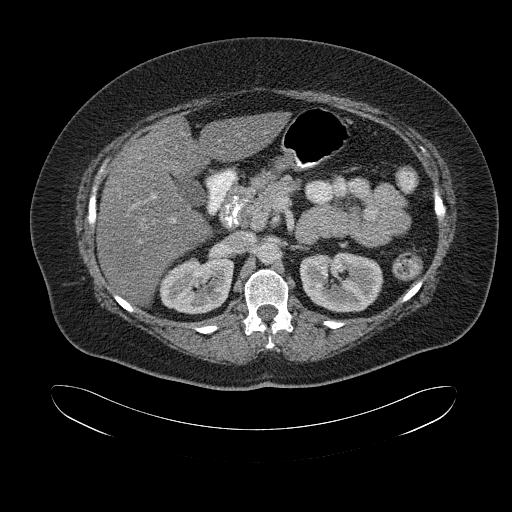
[im 143/181  soft-tissue]
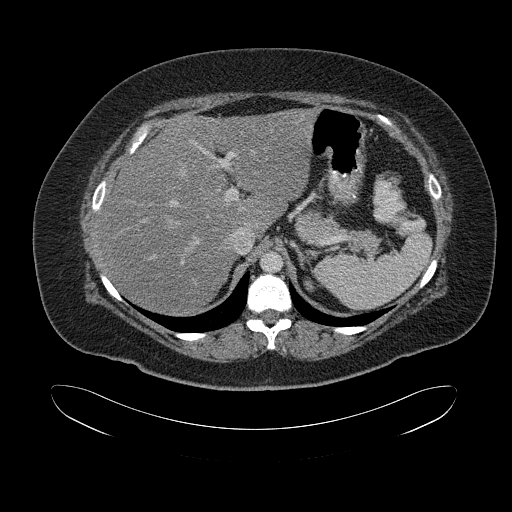
[im 151/181  lung]
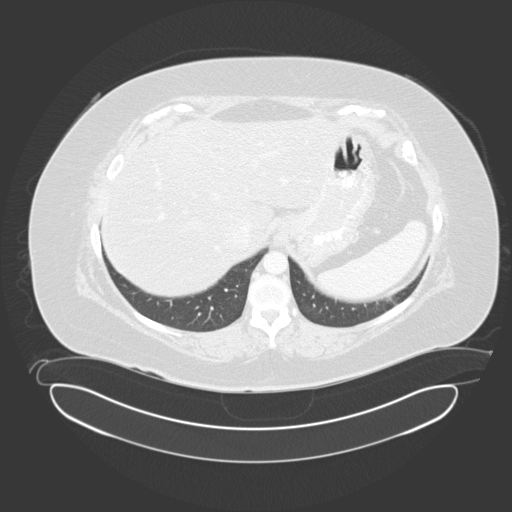
[im 158/181  soft-tissue]
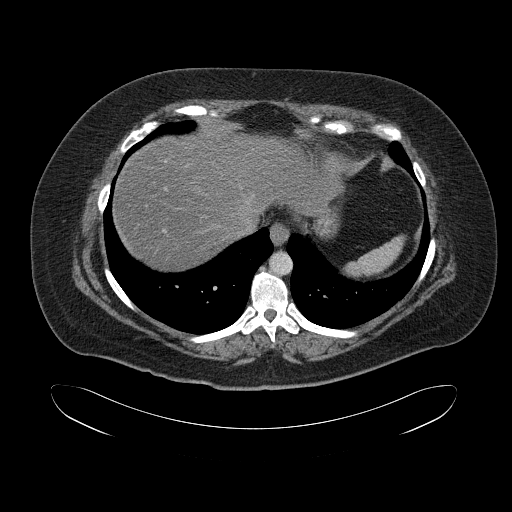
[im 158/181  lung]
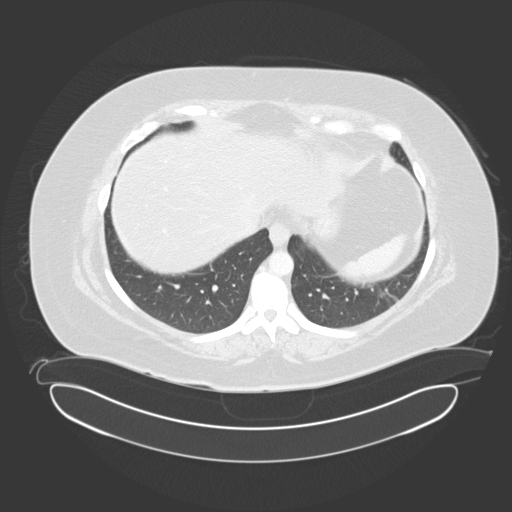
[im 166/181  lung]
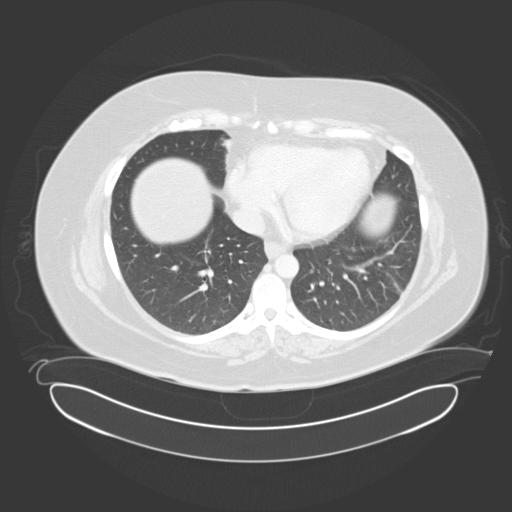
[im 173/181  soft-tissue]
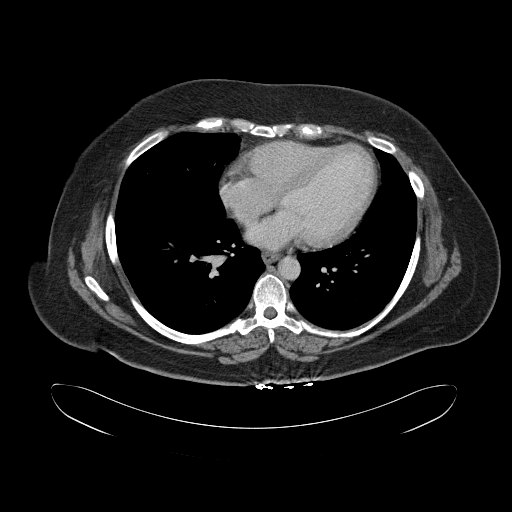
[im 173/181  lung]
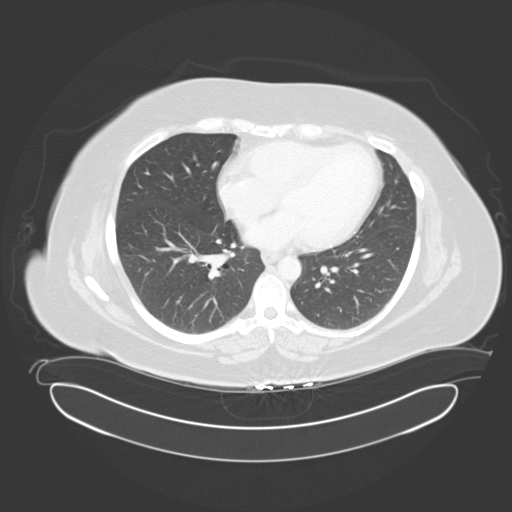

[15 of 32 positions shown; findings below may reference images not displayed]

PROCEDURE:     CT  - CT ABDOMEN / PELVIS  W  - [DATE]  [DATE]

RESULT:     Axial CT scanning was performed through the abdomen and pelvis
at 3 mm intervals and slice thicknesses following intravenous administration
of 100 cc of [GD]. The patient also received oral contrast material.
Review of multiplanar reconstructed images was performed separately on the
VIA monitor. Comparison is made to the study [DATE].

The liver exhibits decreased density diffusely consistent with fatty
infiltration. The gallbladder is adequately distended with no evidence of
calcified stones. The pancreas, spleen, nondistended stomach, adrenal
glands, and kidneys exhibit no acute abnormality. There are nonobstructing
stones in the lower poles of both kidneys. The caliber of the abdominal
aorta is normal.

The orally administered contrast has traversed the small bowel and has
reached the distal transverse colon. There is no evidence of bowel
obstruction nor ileus. There is no evidence of acute diverticulitis. A
normal appendix is seen on images 111 through 130.

Within the pelvis the partially distended urinary bladder is normal in
appearance. The uterus is normal in density and contour. There is mild
fullness of the ovaries suggesting the presence of multiple cysts or
dominant follicles. There is no free fluid in the pelvis. I see no inguinal
nor significant umbilical hernia. The lumbar vertebral bodies are preserved
in height. The lung bases are clear.
IMPRESSION: 1. I do not see evidence of bowel obstruction nor ileus. There is no
evidence of acute appendicitis nor acute diverticulitis.
2. There are fatty infiltrative changes within the liver. I see no evidence
of hepatobiliary abnormality otherwise.
3. There are nonobstructing stones in the lower poles of both kidneys. There
are no findings to suggest pyelonephritis.
4. There is fullness in the adnexal regions bilaterally which may reflect
ovarian cystic processes. Pelvic ultrasound could be considered if the
patient's symptoms warrant further evaluation.

## 2010-09-27 IMAGING — CR DG ABDOMEN 1V
1 series · 3 of 3 positions shown · non-contrast
Comparison: none

REASON FOR EXAM: nephrolithaisis
COMMENTS:

[Series 1: view not recorded · 0.17mm/px · 3 of 3 slices shown]
[im 1/3]
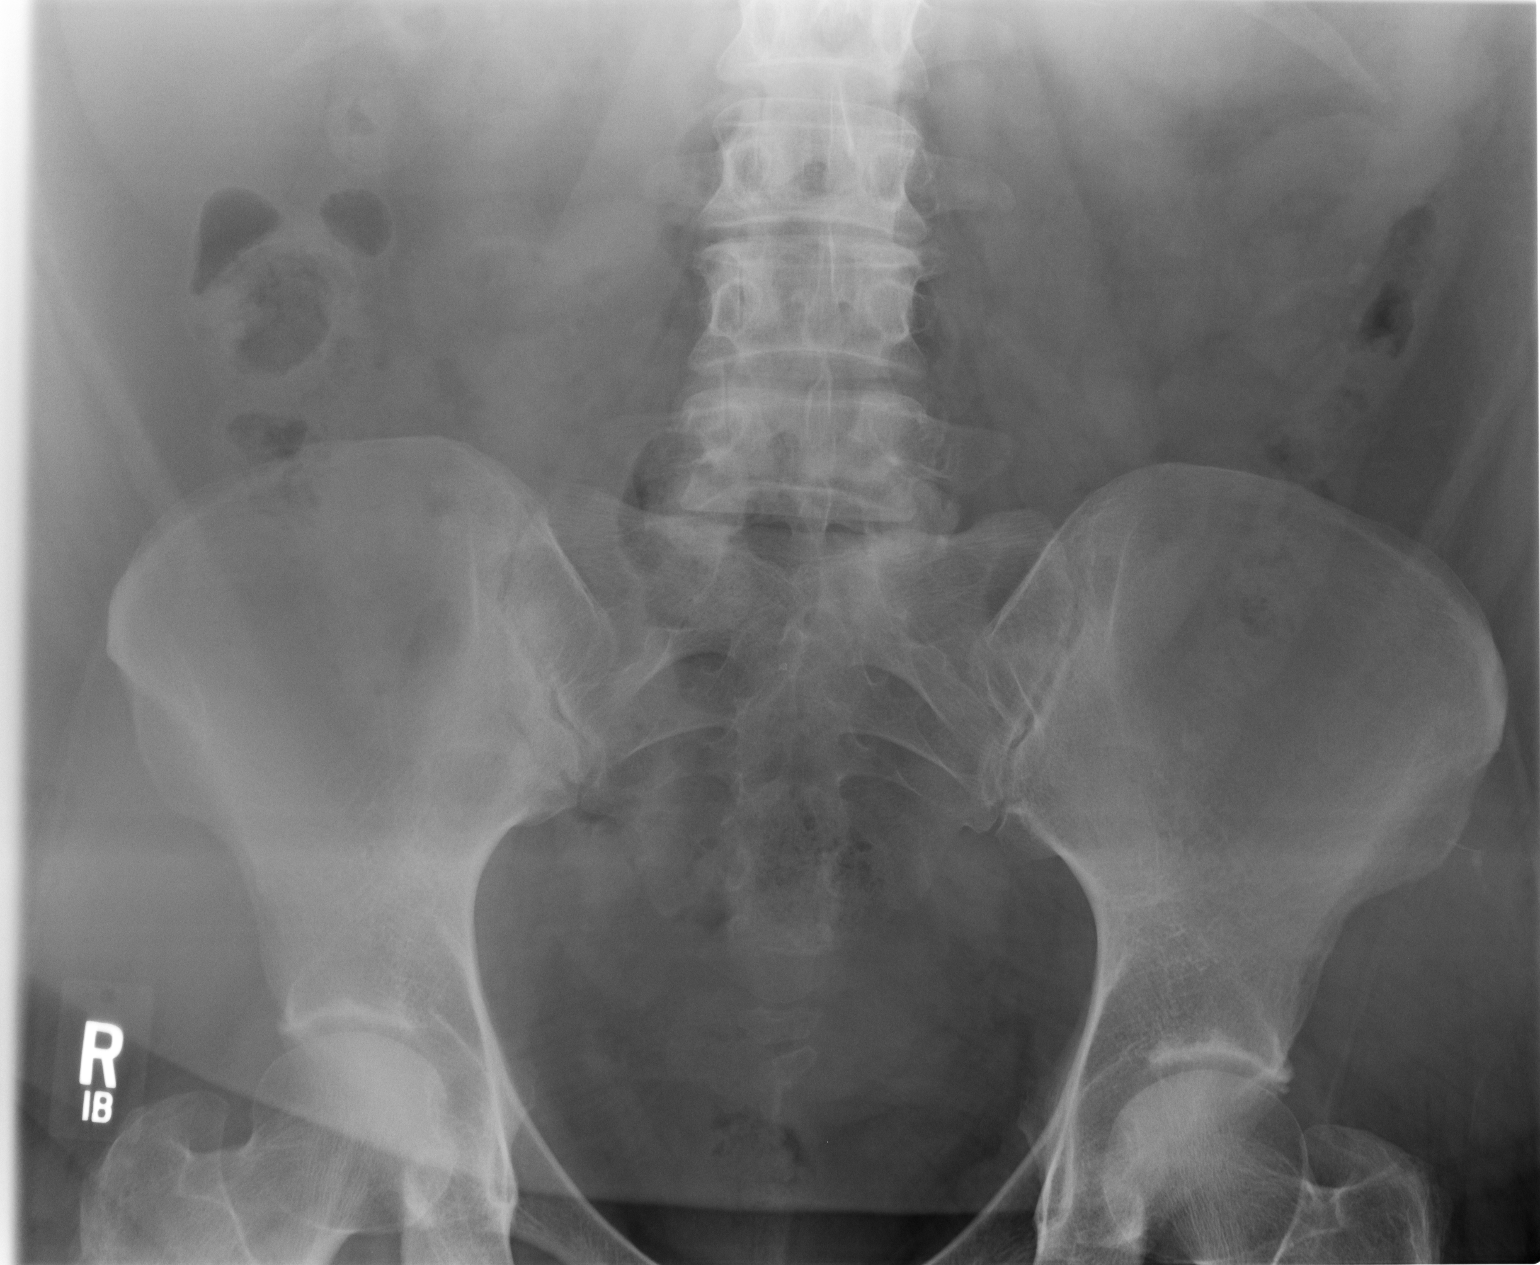
[im 2/3]
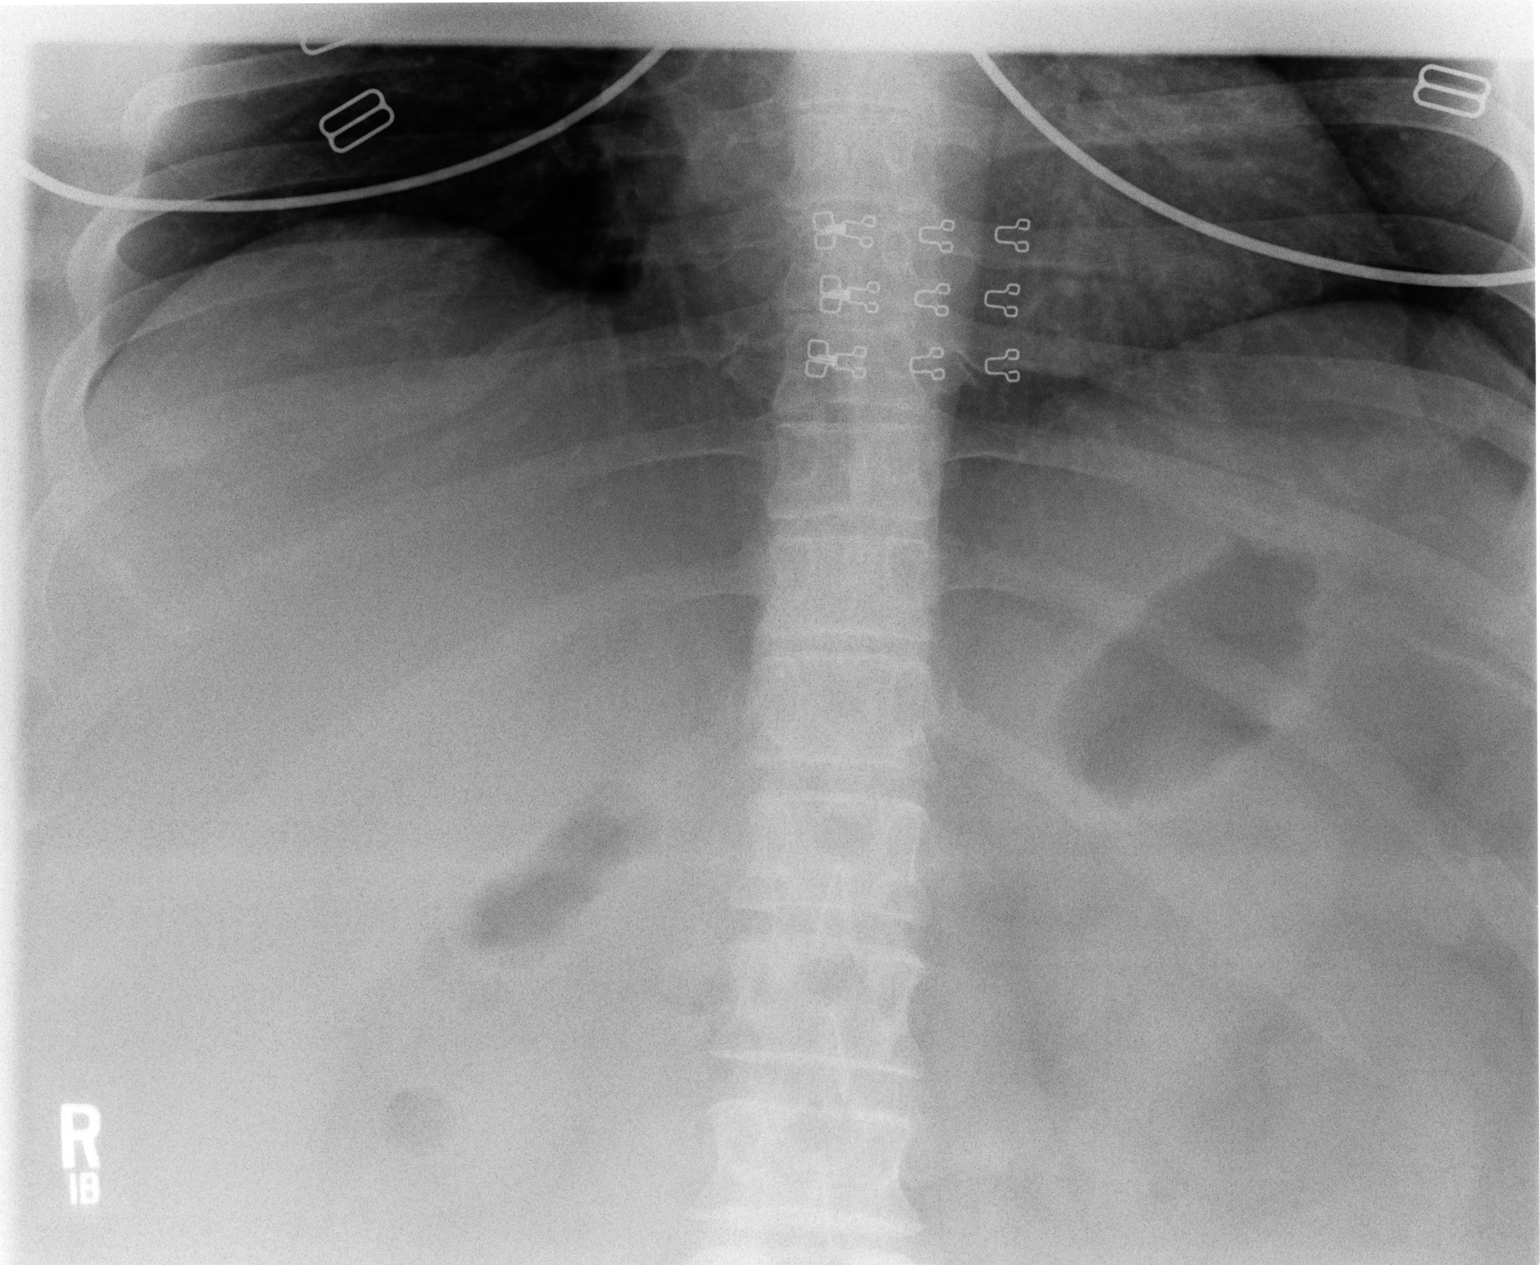
[im 3/3]
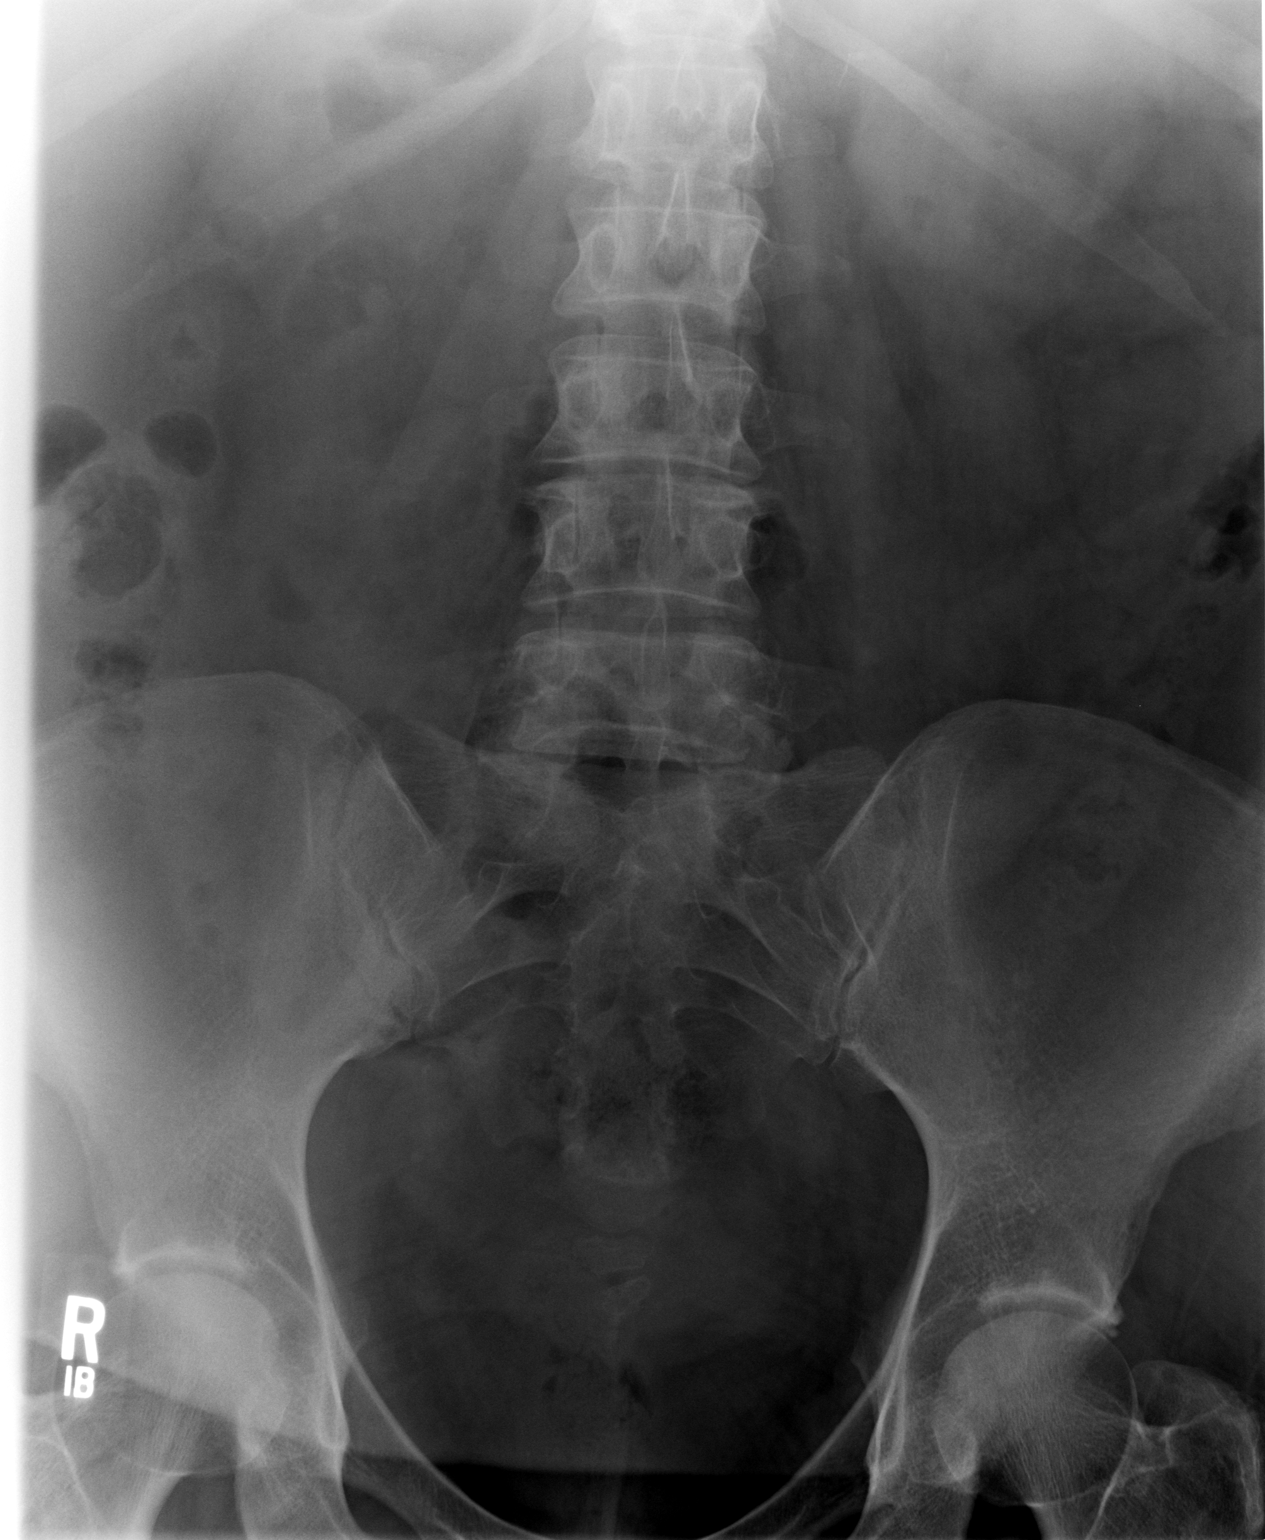

[3 of 3 positions shown; findings below may reference images not displayed]

PROCEDURE:     DXR - DXR KIDNEY URETER BLADDER  - [DATE]  [DATE]

RESULT:     Bilateral nephrolithiasis has been previously demonstrated at
CT. On the current routine radiograph, there is a faint, 5.8 mm density
projected over the lower pole of the right kidney. This could either
represent a poorly calcified stone or could be due to overlying bowel and
bowel content. The bowel gas pattern is normal. No acute bony abnormalities
are seen.
IMPRESSION: 1.  No definite renal or ureteral calcifications are identified.
2.  There is an equivocal density projected over the lower pole of the right
kidney as noted above.

## 2011-02-04 ENCOUNTER — Emergency Department: Payer: Self-pay | Admitting: Emergency Medicine

## 2011-02-04 IMAGING — CR RIGHT HAND - COMPLETE 3+ VIEW
1 series · 3 of 3 positions shown · non-contrast
Comparison: none

REASON FOR EXAM: injury
COMMENTS:   May transport without cardiac monitor

PROCEDURE:     DXR - DXR HAND RT COMPLETE W/OBLIQUES  - [DATE]  [DATE]
RESULT:     Comparison: None.

[Series 1: pa · 0.17mm/px · 3 of 3 slices shown]
[im 1/3]
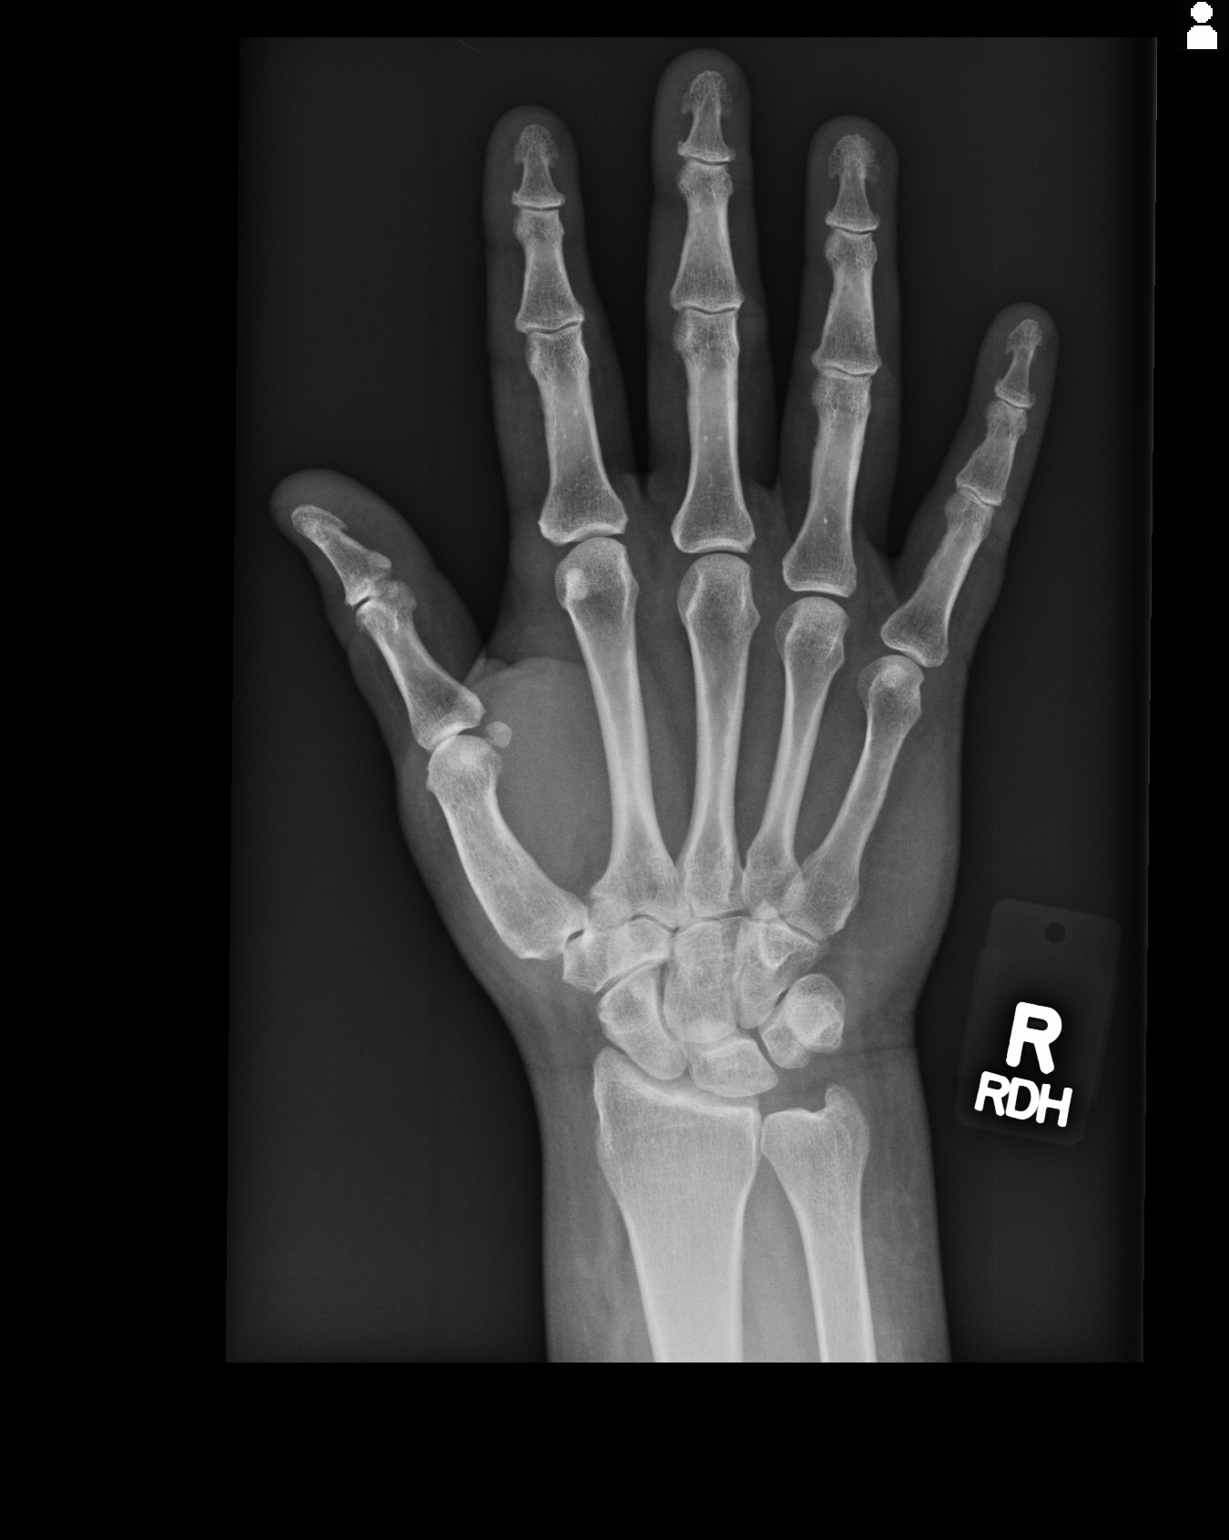
[im 2/3]
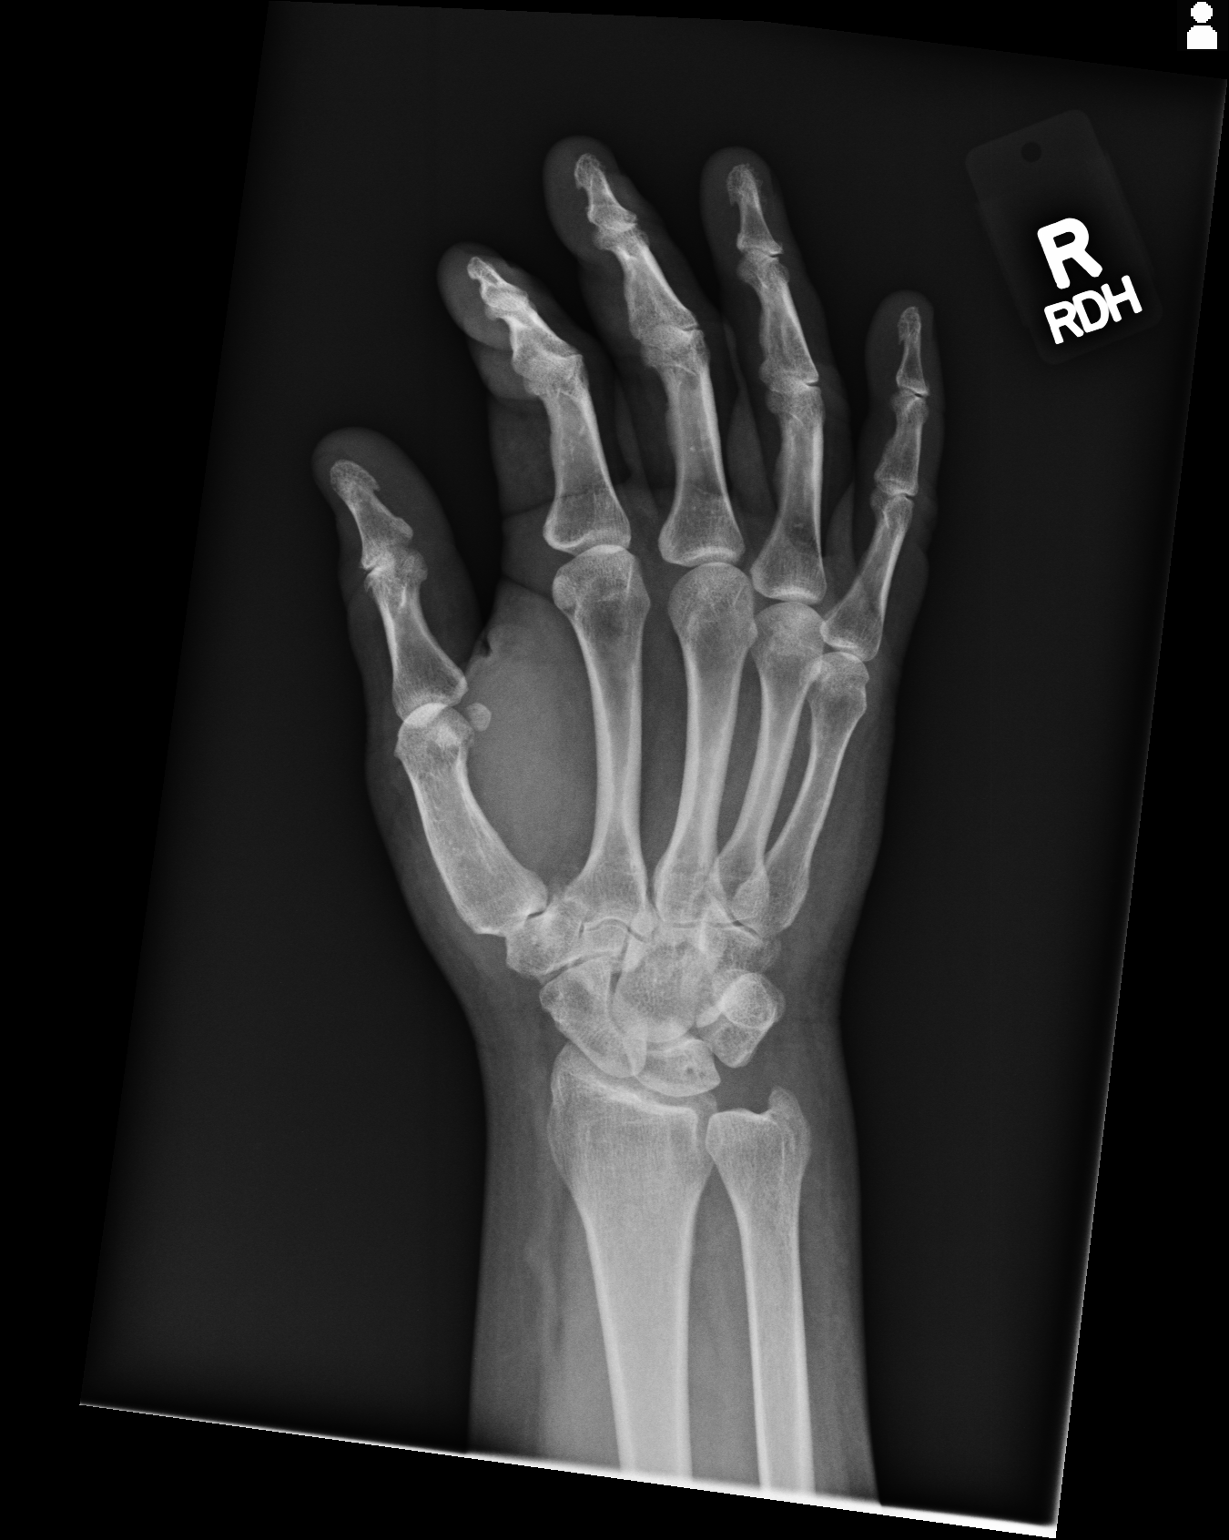
[im 3/3]
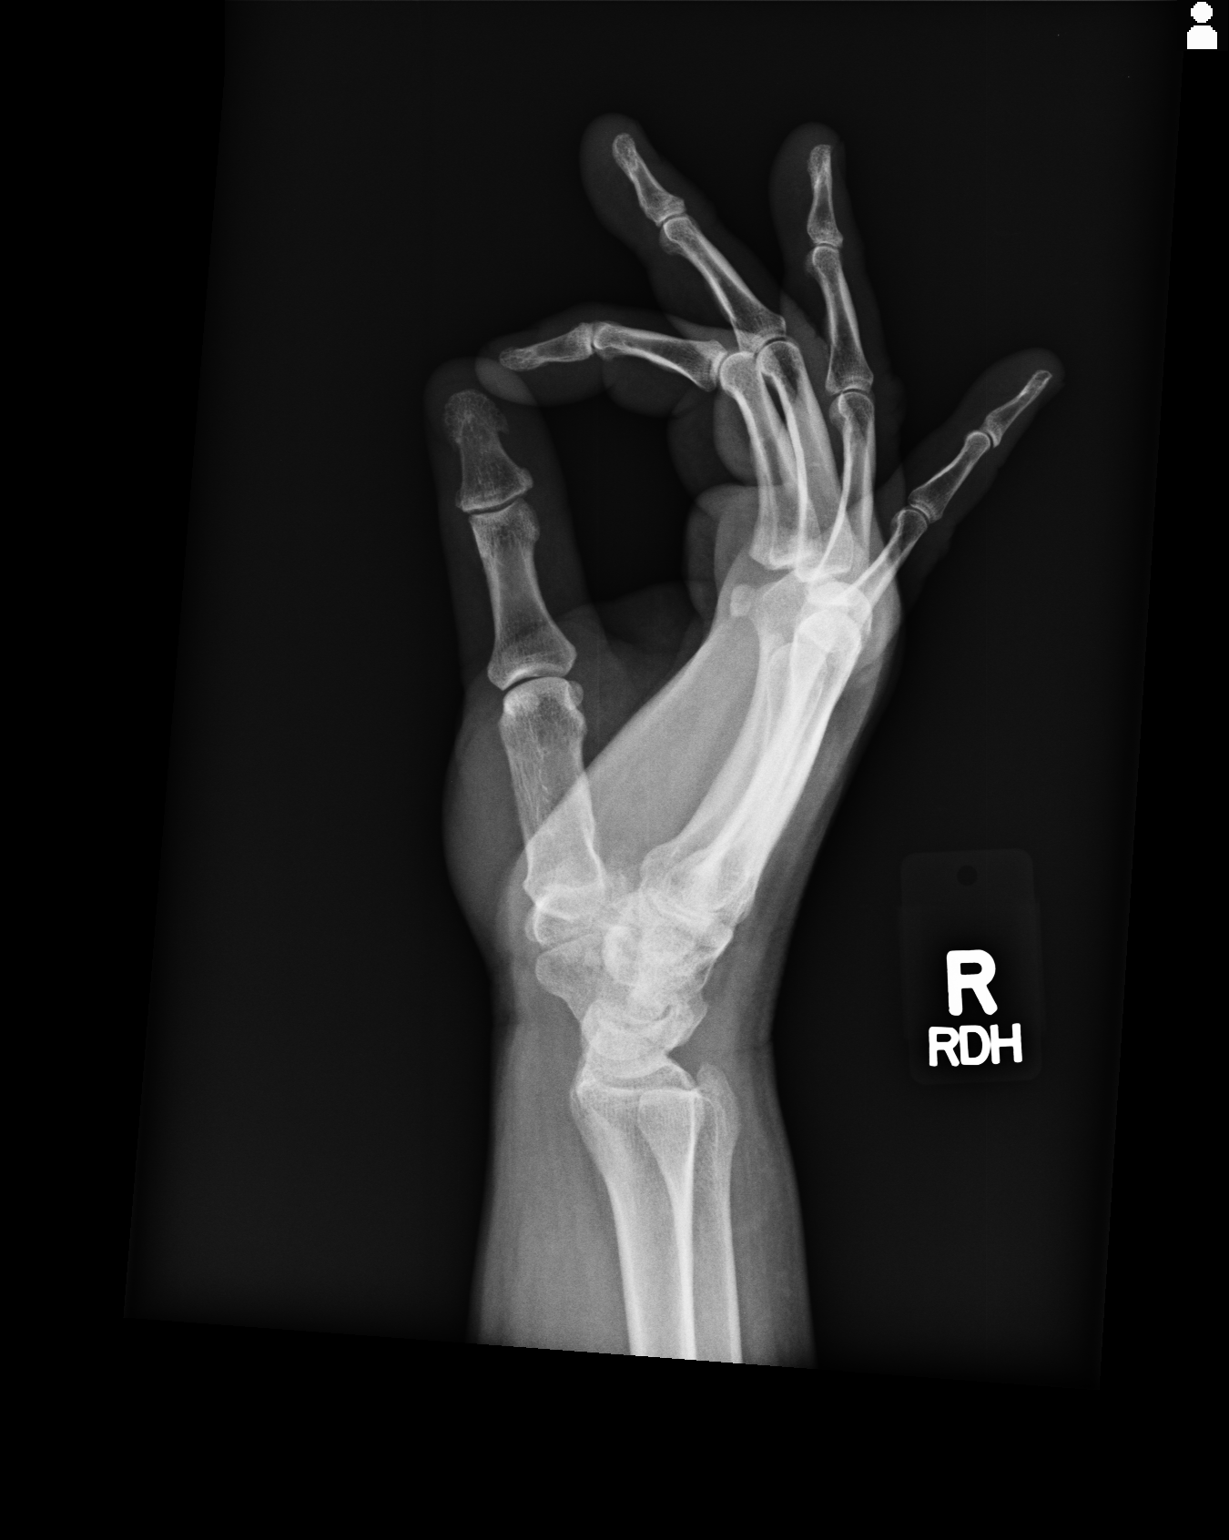

[3 of 3 positions shown; findings below may reference images not displayed]

FINDINGS: No acute fracture or dislocation. There are minimal degenerative changes at
the first carpometacarpal joint.
IMPRESSION: No acute fracture.

## 2011-02-04 IMAGING — CR LEFT WRIST - COMPLETE 3+ VIEW
1 series · 4 of 4 positions shown · non-contrast
Comparison: none

REASON FOR EXAM: injury
COMMENTS:   May transport without cardiac monitor

PROCEDURE:     DXR - DXR WRIST LT COMP WITH OBLIQUES  - [DATE]  [DATE]
RESULT:     Comparison: None.

[Series 1: pa · 0.17mm/px · 4 of 4 slices shown]
[im 1/4]
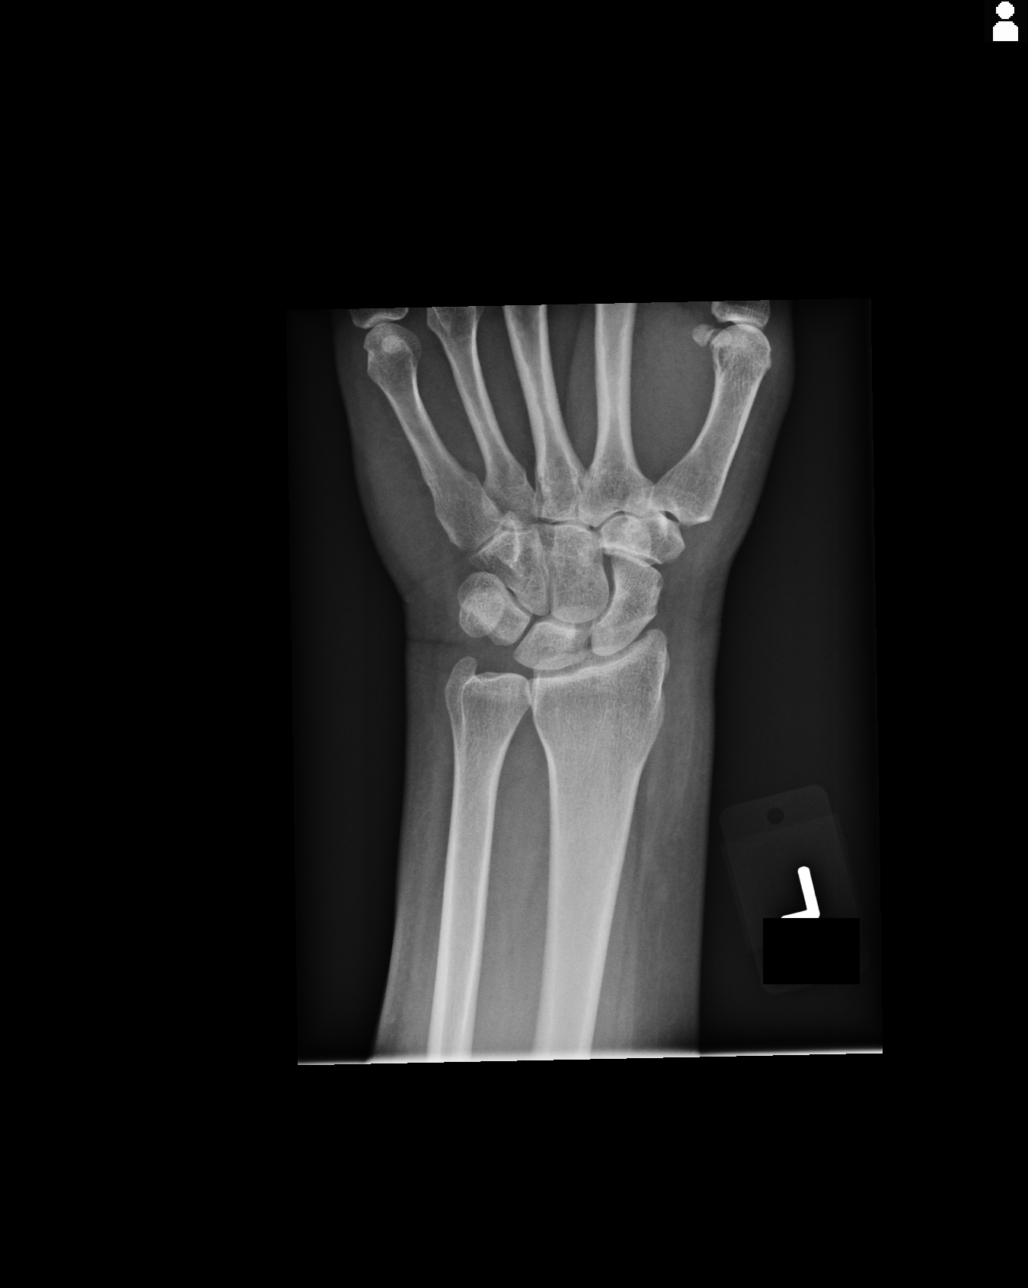
[im 2/4]
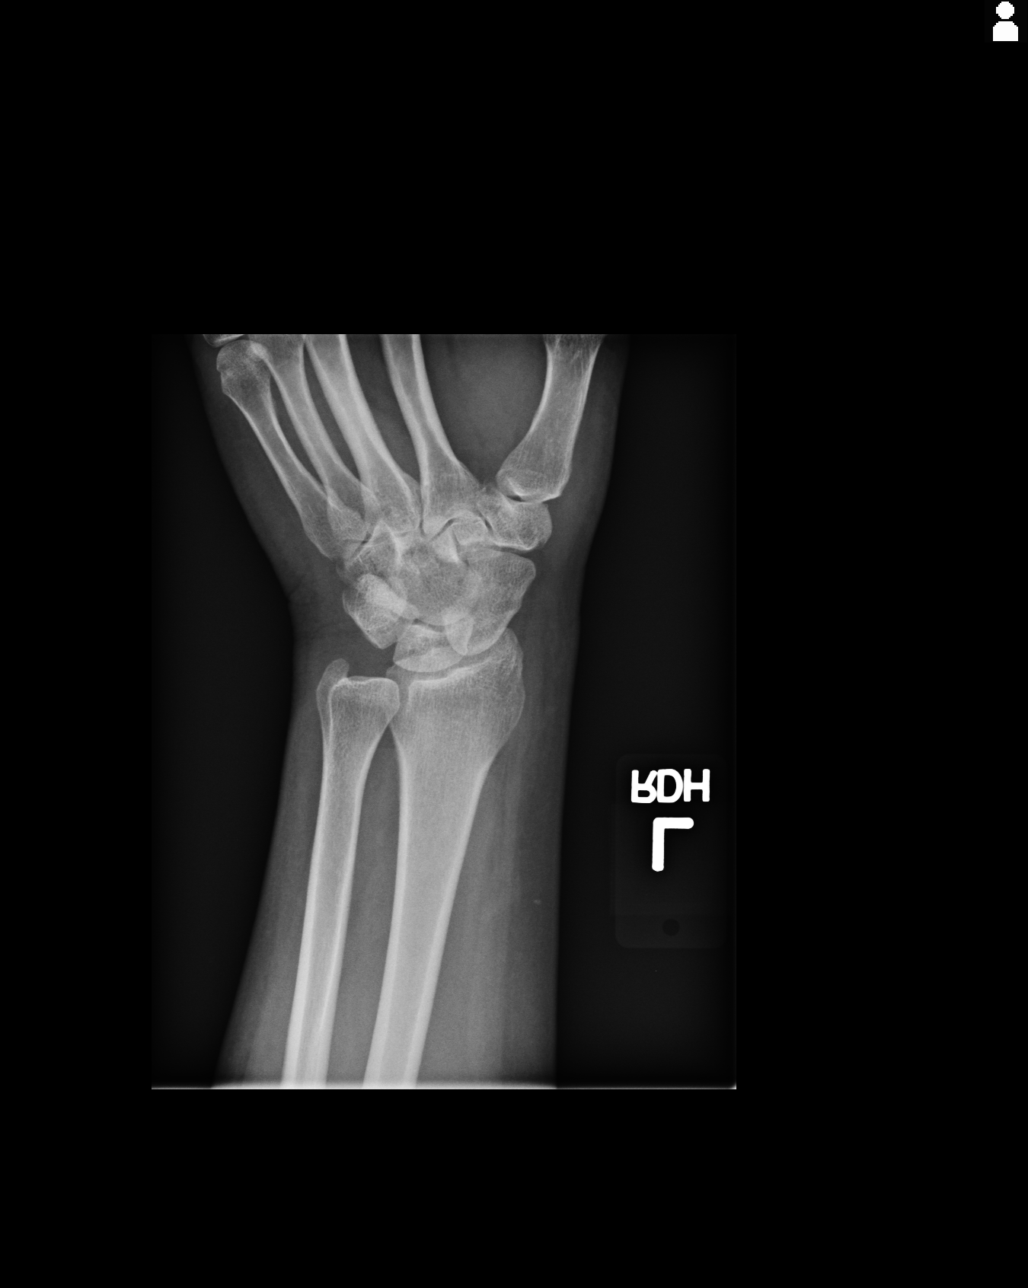
[im 3/4]
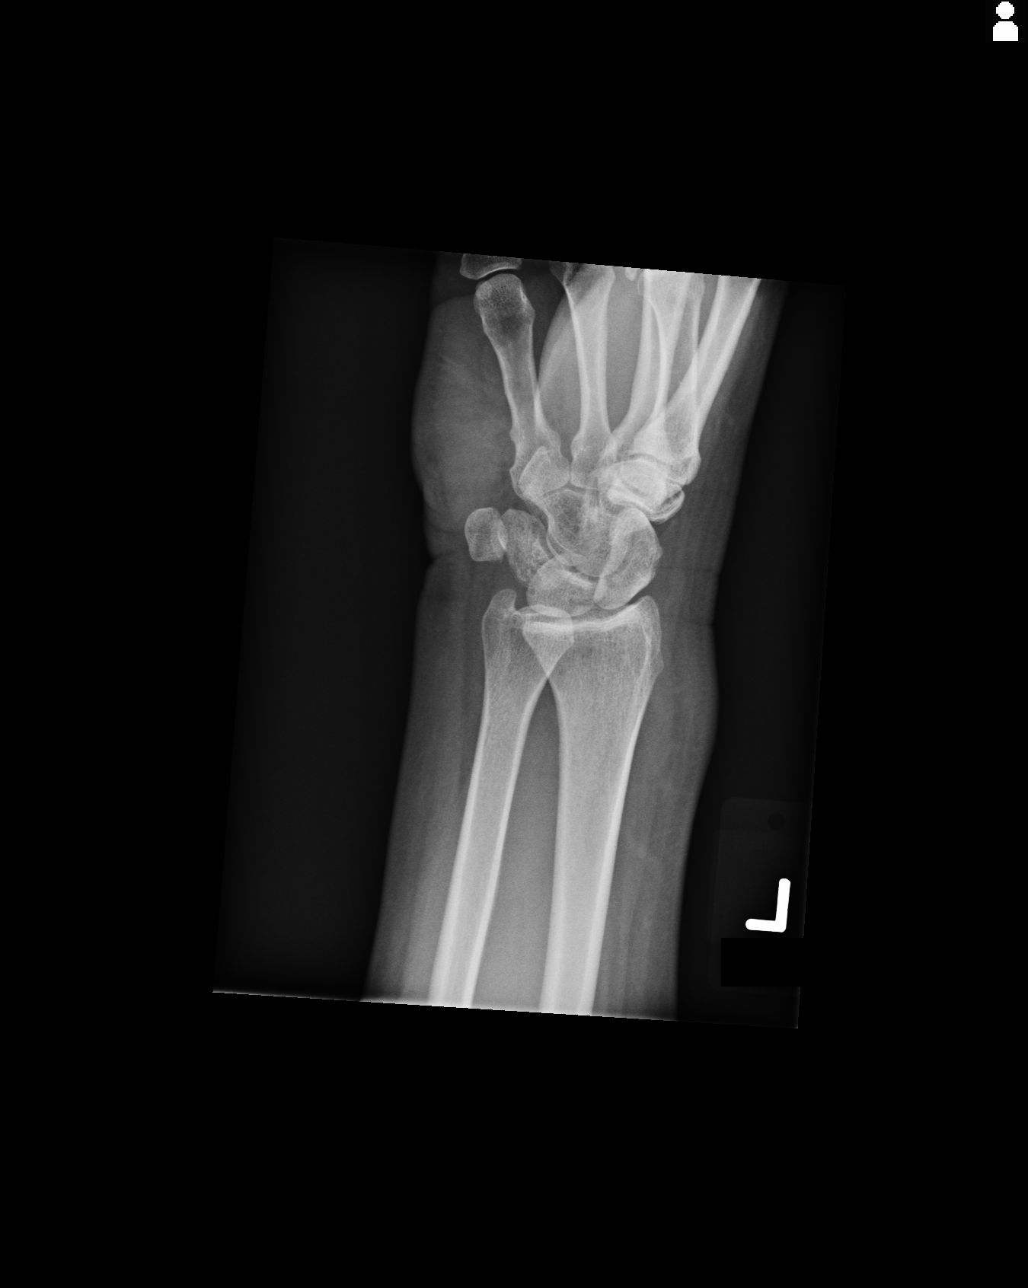
[im 4/4]
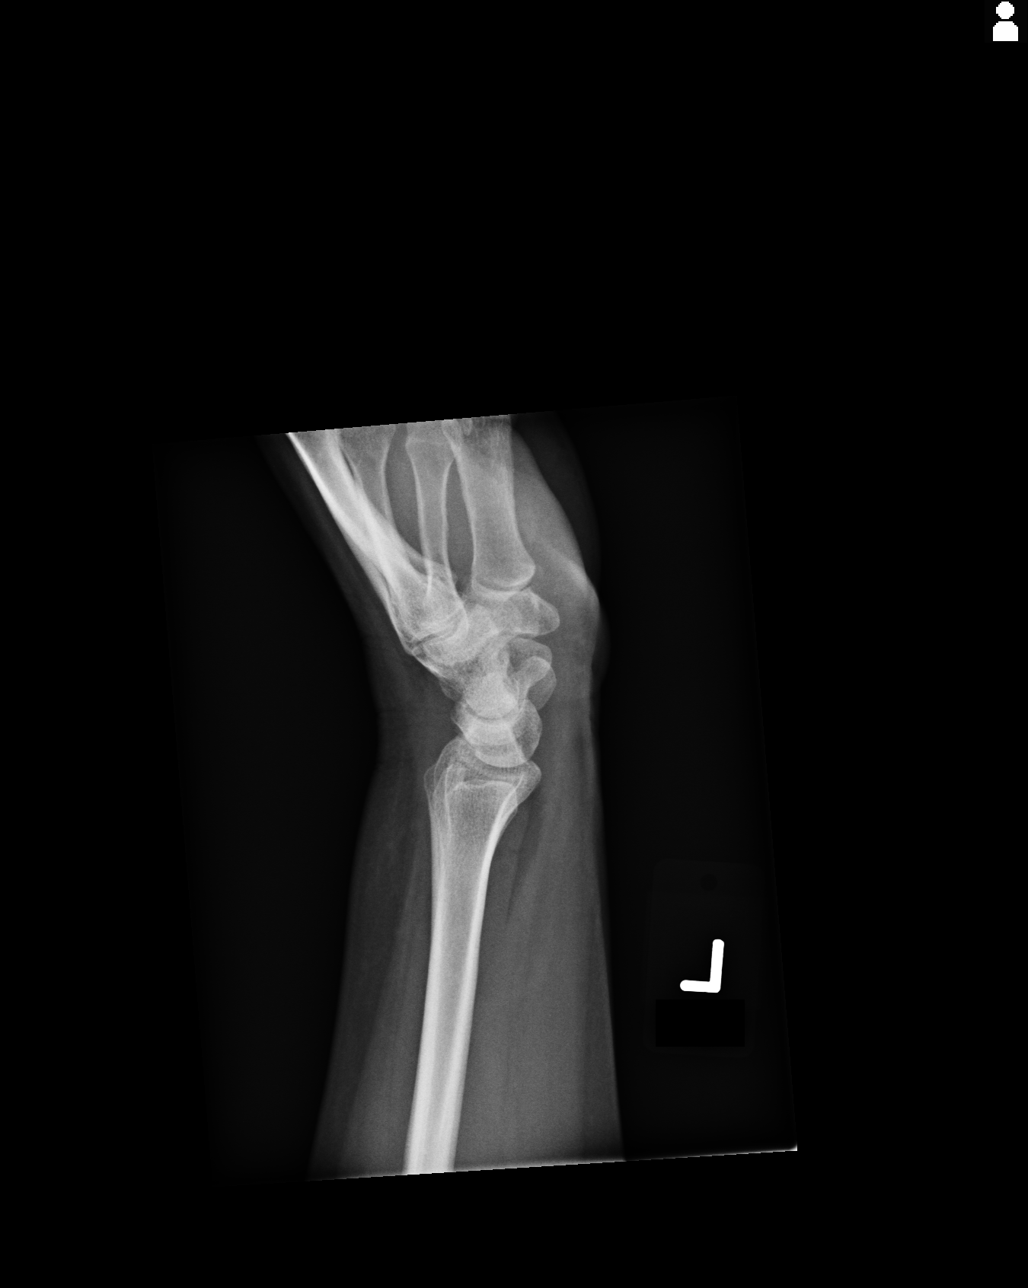

[4 of 4 positions shown; findings below may reference images not displayed]

FINDINGS: No acute fracture. Normal alignment.
IMPRESSION: No acute fracture.

## 2017-03-19 ENCOUNTER — Other Ambulatory Visit: Payer: Self-pay | Admitting: Family Medicine

## 2017-03-20 ENCOUNTER — Other Ambulatory Visit: Payer: Self-pay | Admitting: Family Medicine

## 2017-03-20 DIAGNOSIS — Z1239 Encounter for other screening for malignant neoplasm of breast: Secondary | ICD-10-CM

## 2018-01-15 ENCOUNTER — Inpatient Hospital Stay: Admission: RE | Admit: 2018-01-15 | Payer: Self-pay | Source: Ambulatory Visit

## 2018-01-17 ENCOUNTER — Ambulatory Visit
Admission: RE | Admit: 2018-01-17 | Discharge: 2018-01-17 | Disposition: A | Discharge status: Home / Self Care | Payer: Medicaid Other | Source: Ambulatory Visit | Attending: Family Medicine | Admitting: Family Medicine

## 2018-01-17 DIAGNOSIS — Z1239 Encounter for other screening for malignant neoplasm of breast: Secondary | ICD-10-CM | POA: Diagnosis present

## 2018-01-17 IMAGING — MG DIGITAL SCREENING BILATERAL MAMMOGRAM WITH TOMO AND CAD
8 of 14 series · 8 of 40 positions shown · non-contrast
Comparison: None.

ACR Breast Density Category a: The breast tissue is almost entirely
fatty.

CLINICAL DATA: Screening.

EXAM:
DIGITAL SCREENING BILATERAL MAMMOGRAM WITH TOMO AND CAD

[R CC synth-2D (1 of 2)]
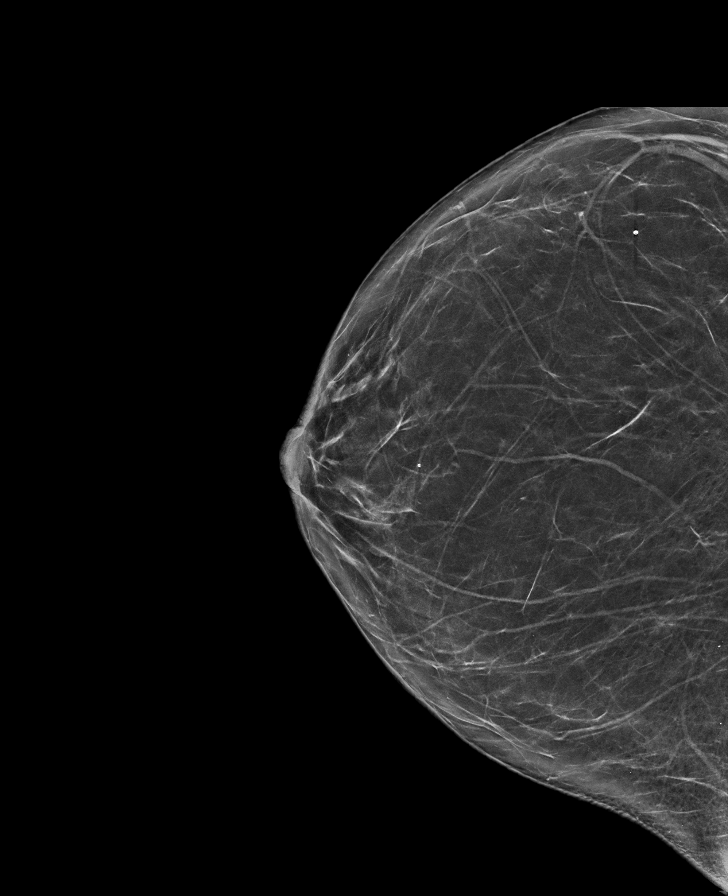

[R MLO synth-2D (1 of 2)]
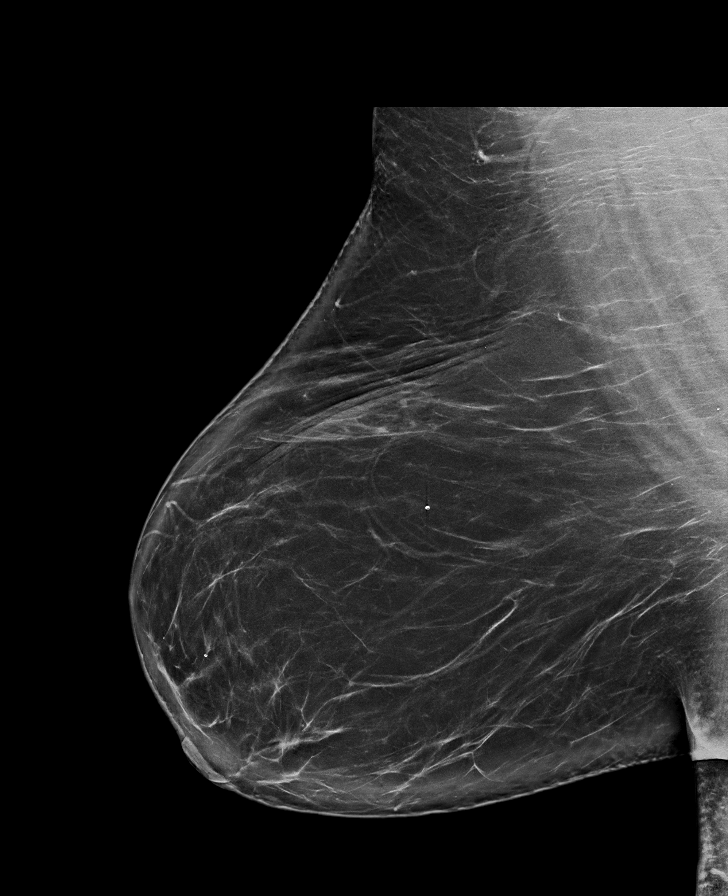

[R CC synth-2D (2 of 2)]
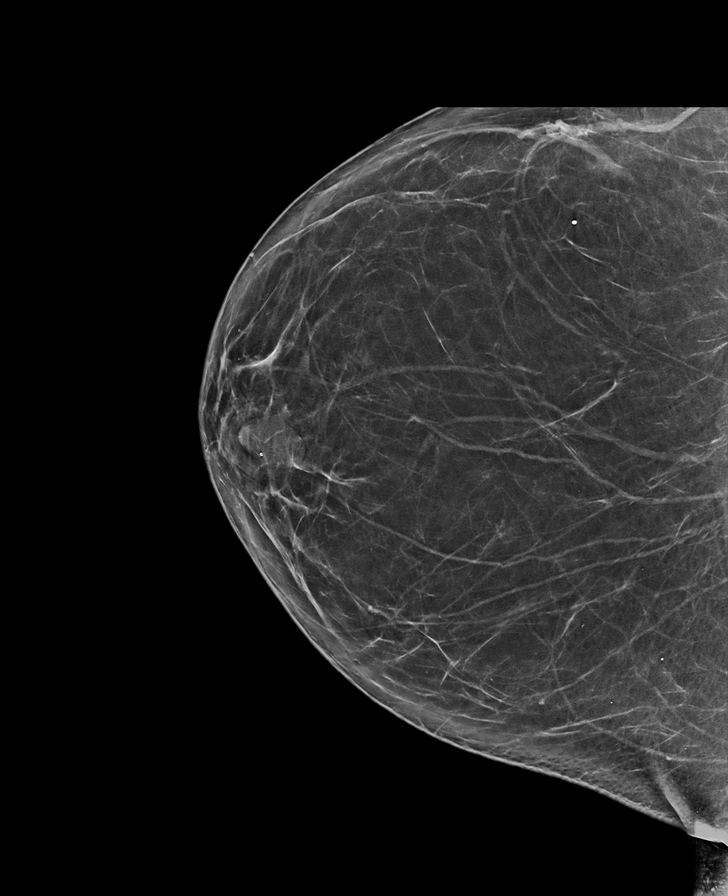

[L CV synth-2D]
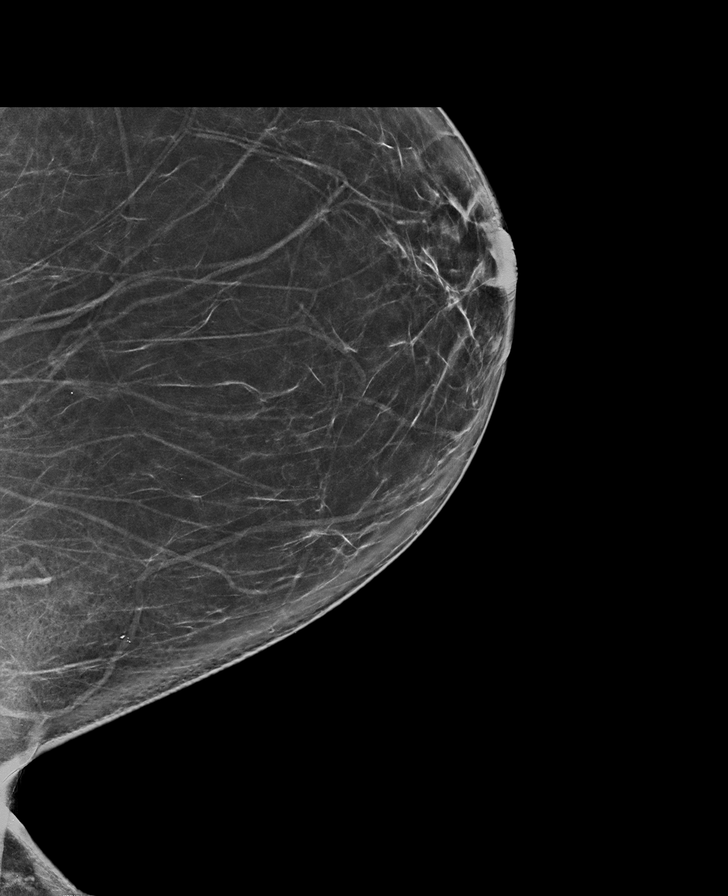

[L CC synth-2D]
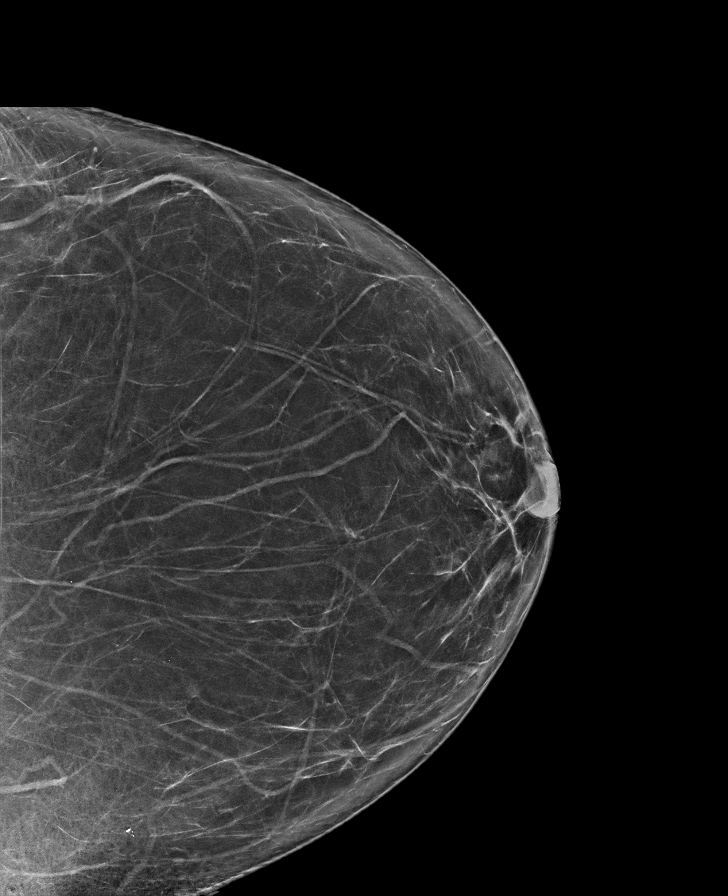

[R MLO synth-2D (2 of 2)]
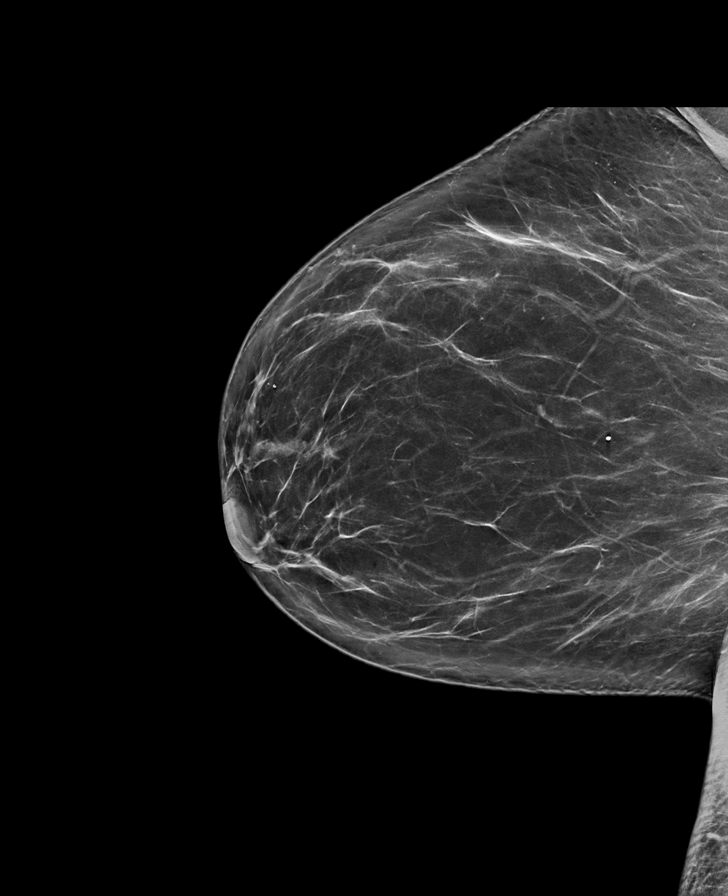

[L MLO synth-2D]
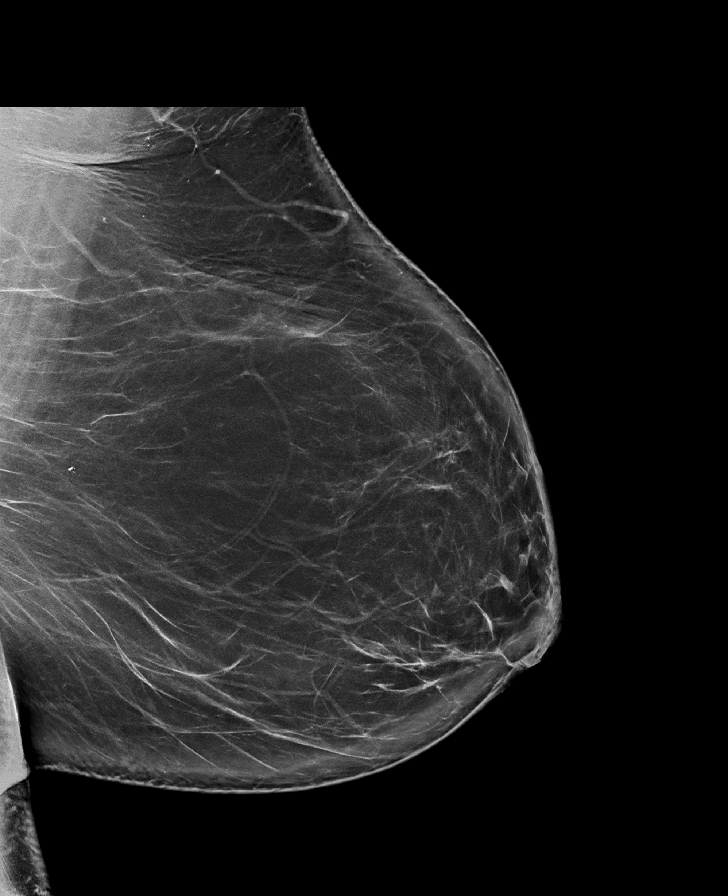

[R CC tomo · tomo slice 39/77.0]
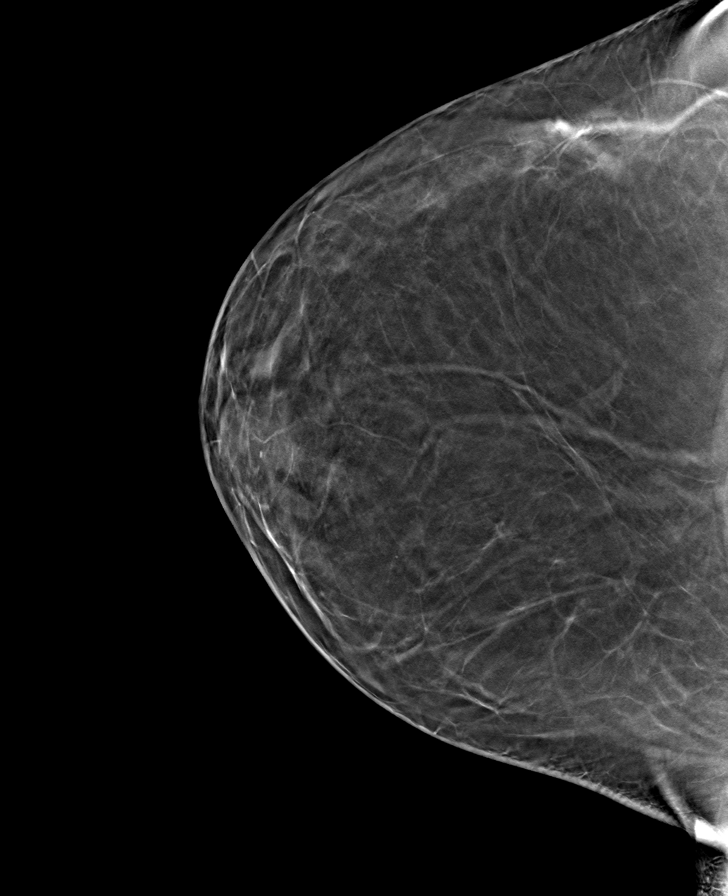

[8 of 40 positions shown; findings below may reference images not displayed]

FINDINGS: There are no findings suspicious for malignancy. Images were
processed with CAD.
IMPRESSION: No mammographic evidence of malignancy. A result letter of this
screening mammogram will be mailed directly to the patient.

RECOMMENDATION:
Screening mammogram in one year. (Code:[09])

BI-RADS CATEGORY  1: Negative.

## 2018-04-24 ENCOUNTER — Other Ambulatory Visit: Payer: Self-pay | Admitting: General Surgery

## 2018-04-24 DIAGNOSIS — Z01818 Encounter for other preprocedural examination: Secondary | ICD-10-CM

## 2018-05-01 ENCOUNTER — Other Ambulatory Visit: Payer: Medicaid Other

## 2018-06-02 ENCOUNTER — Ambulatory Visit
Admission: EM | Admit: 2018-06-02 | Discharge: 2018-06-02 | Disposition: A | Discharge status: Home / Self Care | Payer: Medicaid Other | Attending: Family Medicine | Admitting: Family Medicine

## 2018-06-02 ENCOUNTER — Other Ambulatory Visit: Payer: Self-pay

## 2018-06-02 DIAGNOSIS — L03211 Cellulitis of face: Secondary | ICD-10-CM

## 2018-06-02 HISTORY — DX: Essential (primary) hypertension: I10

## 2018-06-02 MED ORDER — CIPROFLOXACIN HCL 500 MG PO TABS: 500.0000 mg | ORAL_TABLET | Freq: Two times a day (BID) | ORAL | 0 refills | Status: DC

## 2018-06-02 MED ORDER — DOXYCYCLINE HYCLATE 100 MG PO CAPS: 100.0000 mg | ORAL_CAPSULE | Freq: Two times a day (BID) | ORAL | 0 refills | Status: DC

## 2018-06-02 NOTE — ED Provider Notes (Signed)
[facility]    CSN: [id number] Arrival date & time: 06/02/18  1531  History   Chief Complaint Chief Complaint  Patient presents with  . Otalgia   HPI   48 year old female presents with the above complaint.  Patient reports that she has had pain and swelling around her right ear for the past 4 to 5 days.  Worsened today.  She reports swelling and redness above the tragus and extends into the preauricular region.  No documented fever.  Pain is severe at this time.  She has had some drainage from her ear.  Patient states that she has a history of abscess.  Exacerbated by touch.  No relieving factors.  No other complaints.   PMH, Surgical Hx, Family Hx, Social History reviewed and updated as below.  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 CESAREAN SECTION    . DILATION AND CURETTAGE OF UTERUS    . TONSILLECTOMY AND ADENOIDECTOMY     OB History   No obstetric history on file.      Home Medications    Prior to Admission medications   Medication Sig Start Date End Date Taking? Authorizing Provider  aspirin EC 81 MG tablet Take by mouth. 05/17/16  Yes [provider]  Buprenorphine HCl-Naloxone HCl (SUBOXONE) 8-2 MG FILM PLACE 1 FILM UNDER THE TONGUE TWICE DAILY 05/30/16  Yes [provider]  buPROPion (WELLBUTRIN SR) 150 MG 12 hr tablet TAKE 1 TABLET(150 MG) BY MOUTH TWICE DAILY 08/29/17  Yes [provider]  furosemide (LASIX) 40 MG tablet 40 mg daily. 04/18/17  Yes [provider]  gabapentin (NEURONTIN) 600 MG tablet TK 1 T PO TID 03/27/18  Yes [provider]  lisinopril (PRINIVIL,ZESTRIL) 20 MG tablet 20 mg daily. 04/18/17  Yes [provider]  venlafaxine XR (EFFEXOR-XR) 150 MG 24 hr capsule Take by mouth. 05/11/17 07/17/18  Yes [provider]  ciprofloxacin (CIPRO) 500 MG tablet Take 1 tablet (500 mg total) by mouth 2 (two) times daily. 06/02/18   Tommie Sams, DO  doxycycline (VIBRAMYCIN) 100 MG capsule Take 1 capsule (100 mg total) by mouth 2 (two) times daily. 06/02/18   Tommie Sams, DO    Family History Family History  Problem Relation Age of Onset  . Hypertension Mother   . Kidney cancer Mother   . Diabetes Father   . Hypertension Father   . Depression Father   . Bipolar disorder Father   . Breast cancer Neg Hx     Social History Social History   Tobacco Use  . Smoking status: Former Games developer  . Smokeless tobacco: Never Used  Substance Use Topics  . Alcohol use: Yes    Comment: rarely  . Drug use: Never     Allergies   Cefdinir   Review of Systems Review of Systems  Constitutional: Negative for fever.  HENT: Positive for ear pain.    Physical Exam Triage Vital Signs ED Triage Vitals  Enc Vitals Group     BP 06/02/18 1541 (!) 158/100     Pulse Rate 06/02/18 1541 90     Resp 06/02/18 1541 18     Temp 06/02/18 1541 98.4 F (36.9 C)     Temp Source 06/02/18 1541 Oral     SpO2 06/02/18  1541 98 %     Weight 06/02/18 1538 (!) 335 lb (152 kg)     Height 06/02/18 1538 5\' 10"  (1.778 m)     Head Circumference --      Peak Flow --      Pain Score 06/02/18 1538 7     Pain Loc --      Pain Edu? --      Excl. in GC? --    No data found.  Updated Vital Signs BP (!) 158/100 (BP Location: Left Arm)   Pulse 90   Temp 98.4 F (36.9 C) (Oral)   Resp 18   Ht 5\' 10"  (1.778 m)   Wt (!) 152 kg   SpO2 98%   BMI 48.07 kg/m   Visual Acuity Right Eye Distance:   Left Eye Distance:   Bilateral Distance:    Right Eye Near:   Left Eye Near:    Bilateral Near:     Physical Exam Constitutional:      General: She is not in acute distress.    Appearance: She is obese.  HENT:     Head: Normocephalic and atraumatic.      Comments: Swelling and erythema noted at the level  location.  Exquisitely tender to palpation.    Ears:      Comments: Swelling right leg location.  Erythema noted as well.  Erythema noted in the canal. Eyes:     General:        Right eye: No discharge.        Left eye: No discharge.     Conjunctiva/sclera: Conjunctivae normal.  Neck:     Musculoskeletal: Neck supple.  Cardiovascular:     Rate and Rhythm: Normal rate and regular rhythm.  Pulmonary:     Effort: Pulmonary effort is normal. No respiratory distress.     Breath sounds: Wheezing present. No rales.  Lymphadenopathy:     Cervical: Cervical adenopathy present.  Neurological:     Mental Status: She is alert.  Psychiatric:        Mood and Affect: Mood normal.        Behavior: Behavior normal.    UC Treatments / Results  Labs (all labs ordered are listed, but only abnormal results are displayed) Labs Reviewed - No data to display  EKG None  Radiology No results found.  Procedures Procedures (including critical care time)  Medications Ordered in UC Medications - No data to display  Initial Impression / Assessment and Plan / UC Course  I have reviewed the triage vital signs and the nursing notes.  Pertinent labs & imaging results that were available during my care of the patient were reviewed by me and considered in my medical decision making (see chart for details).    48 year old female presents with concern for cellulitis and developing abscess of the external ear extending to the preauricular region.  It is possible that this is secondary to otitis externa.  I am covering for both Pseudomonas as well as staph and strep with Cipro as well as doxycycline.  Advised to go to the hospital if she fails to improve or worsens.  Final Clinical Impressions(s) / UC Diagnoses   Final diagnoses:  Facial cellulitis     Discharge Instructions     If worsens or fails to improve go to the hospital.  Take care  Dr. Adriana Simas    ED Prescriptions    Medication Sig  Dispense Auth. Provider   ciprofloxacin (CIPRO) 500  MG tablet Take 1 tablet (500 mg total) by mouth 2 (two) times daily. 20 tablet Karine Garn G, DO   doxycycline (VIBRAMYCIN) 100 MG capsule Take 1 capsule (100 mg total) by mouth 2 (two) times daily. 20 capsule Tommie Sams, DO     Controlled Substance Prescriptions [place] Controlled Substance Registry consulted? Not Applicable   Tommie Sams, DO 06/02/18 1605

## 2018-06-02 NOTE — Discharge Instructions (Signed)
If worsens or fails to improve go to the hospital.  Take care  Dr. Adriana Simas

## 2018-06-02 NOTE — ED Triage Notes (Signed)
Patient complains of right ear pain that occurs on the outside that started around 4-5 days ago with redness and pain. 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

## 2018-11-12 ENCOUNTER — Other Ambulatory Visit: Payer: Self-pay | Admitting: Neurology

## 2018-11-12 DIAGNOSIS — G932 Benign intracranial hypertension: Secondary | ICD-10-CM

## 2018-11-18 ENCOUNTER — Other Ambulatory Visit: Payer: Self-pay

## 2018-11-18 ENCOUNTER — Ambulatory Visit
Admission: RE | Admit: 2018-11-18 | Discharge: 2018-11-18 | Disposition: A | Discharge status: Home / Self Care | Payer: Medicaid Other | Source: Ambulatory Visit | Attending: Neurology | Admitting: Neurology

## 2018-11-18 DIAGNOSIS — G932 Benign intracranial hypertension: Secondary | ICD-10-CM

## 2018-11-18 LAB — CBC
HCT: 44 % (ref 36.0–46.0)
Hemoglobin: 14.3 g/dL (ref 12.0–15.0)
MCH: 30.2 pg (ref 26.0–34.0)
MCHC: 32.5 g/dL (ref 30.0–36.0)
MCV: 93 fL (ref 80.0–100.0)
Platelets: 221 10*3/uL (ref 150–400)
RBC: 4.73 MIL/uL (ref 3.87–5.11)
RDW: 13.1 % (ref 11.5–15.5)
WBC: 8.5 10*3/uL (ref 4.0–10.5)
nRBC: 0 % (ref 0.0–0.2)

## 2018-11-18 LAB — PROTIME-INR
INR: 1 (ref 0.8–1.2)
Prothrombin Time: 13.4 seconds (ref 11.4–15.2)

## 2018-11-18 LAB — POCT PREGNANCY, URINE: Preg Test, Ur: NEGATIVE

## 2018-11-18 LAB — APTT: aPTT: 30 seconds (ref 24–36)

## 2018-11-18 IMAGING — RF DG FLUORO GUIDE SPINAL/SI JT INJ*L*
3 series · 3 of 3 positions shown · non-contrast
Comparison: none

CLINICAL DATA: Idiopathic intracranial hypertension

[Series 1: fluoro_iodine 2fps_bw · 0.17mm/px · 1 of 1 slices shown (1 of 2)]
[im 1/1]
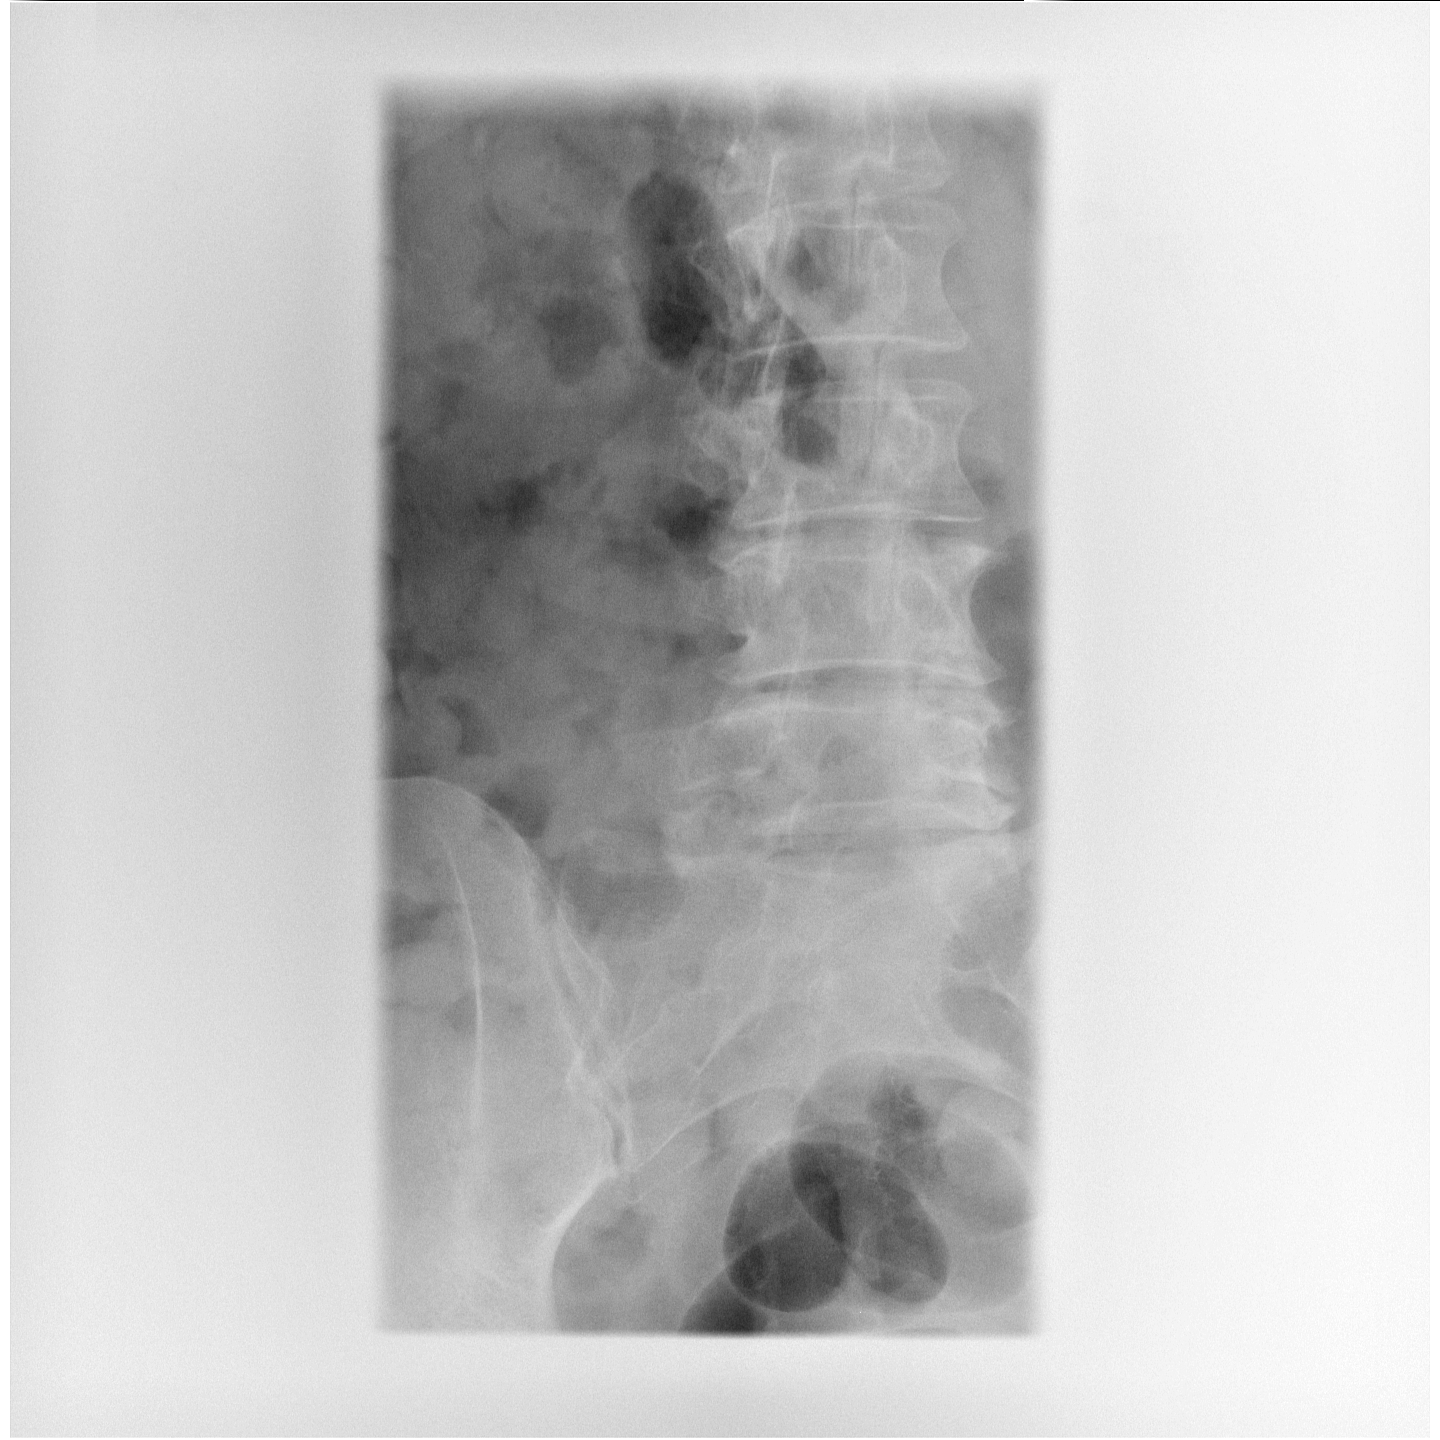

[Series 2: fluoro_iodine 2fps_bw · 0.17mm/px · 1 of 1 slices shown (2 of 2)]
[im 1/1]
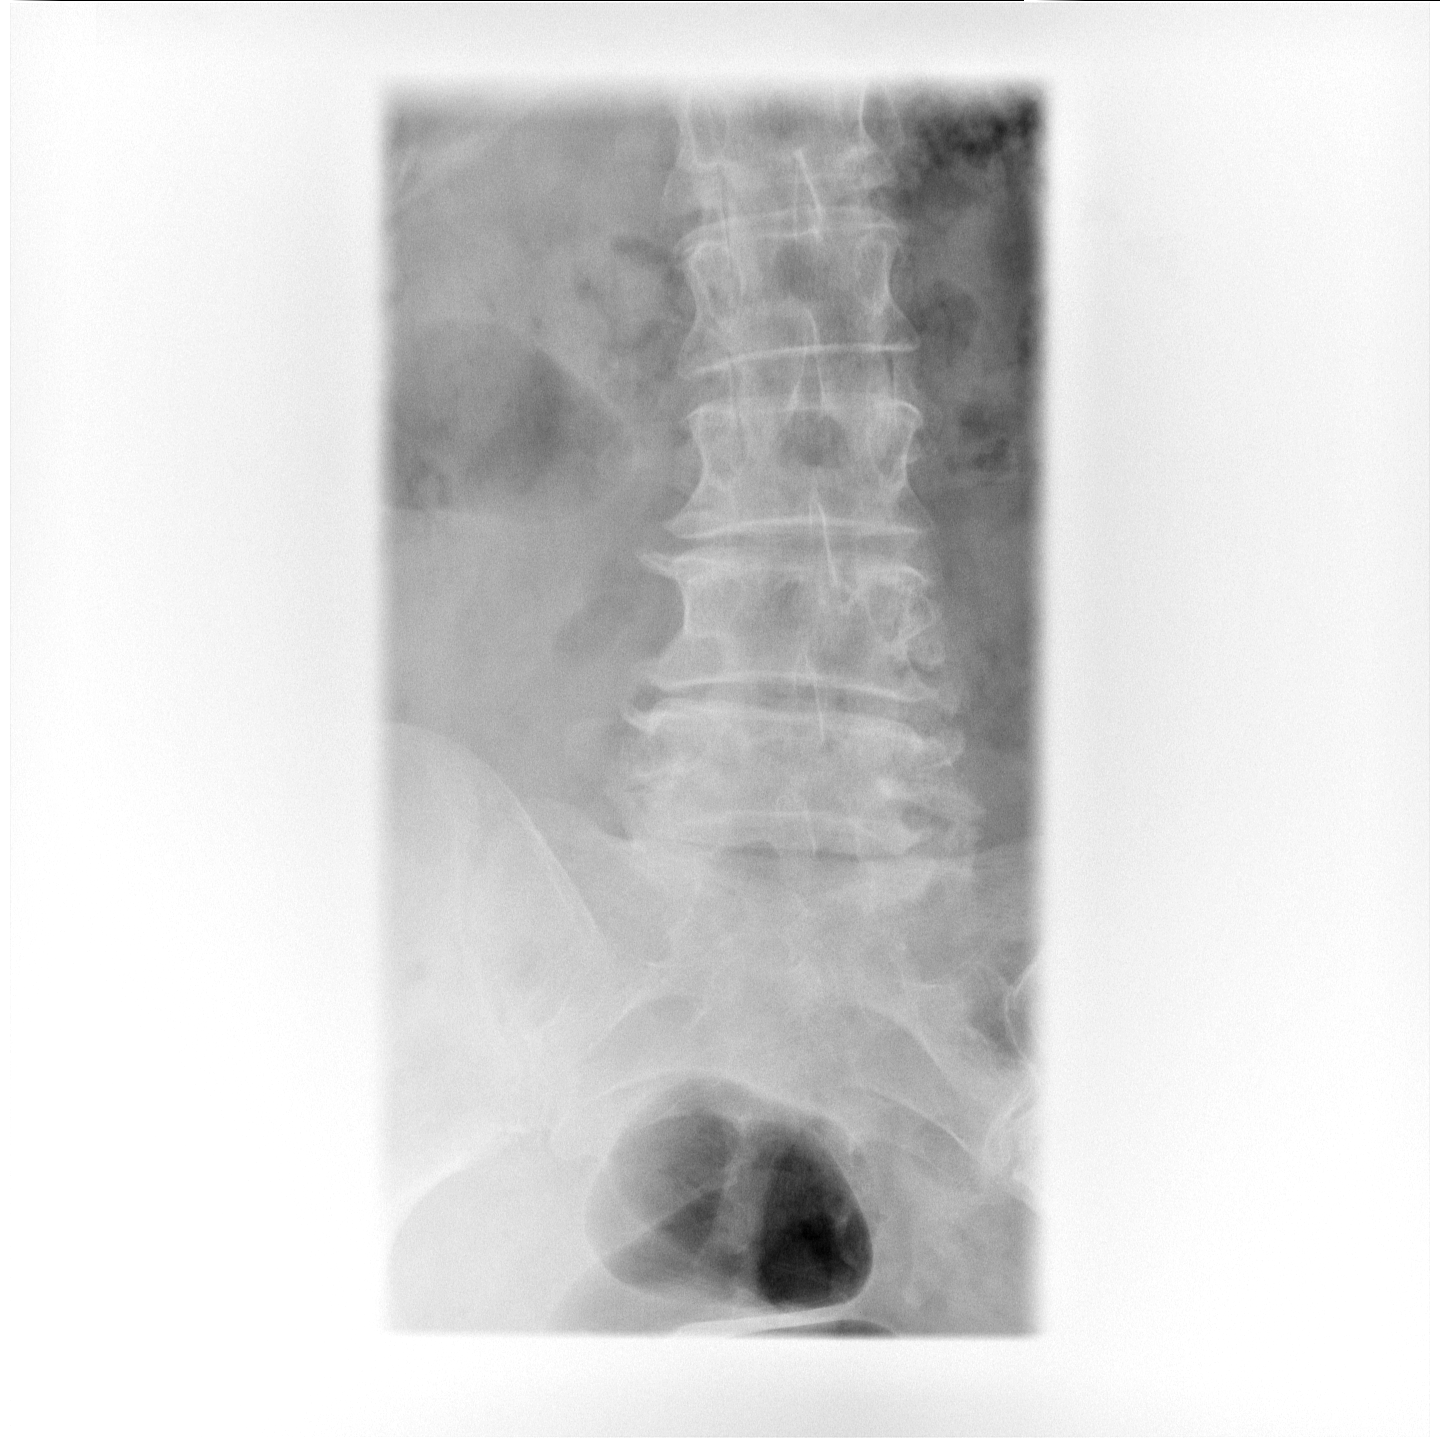

[Series 3: cp_standard · 0.25mm/px · 1 of 1 slices shown]
[im 1/1]
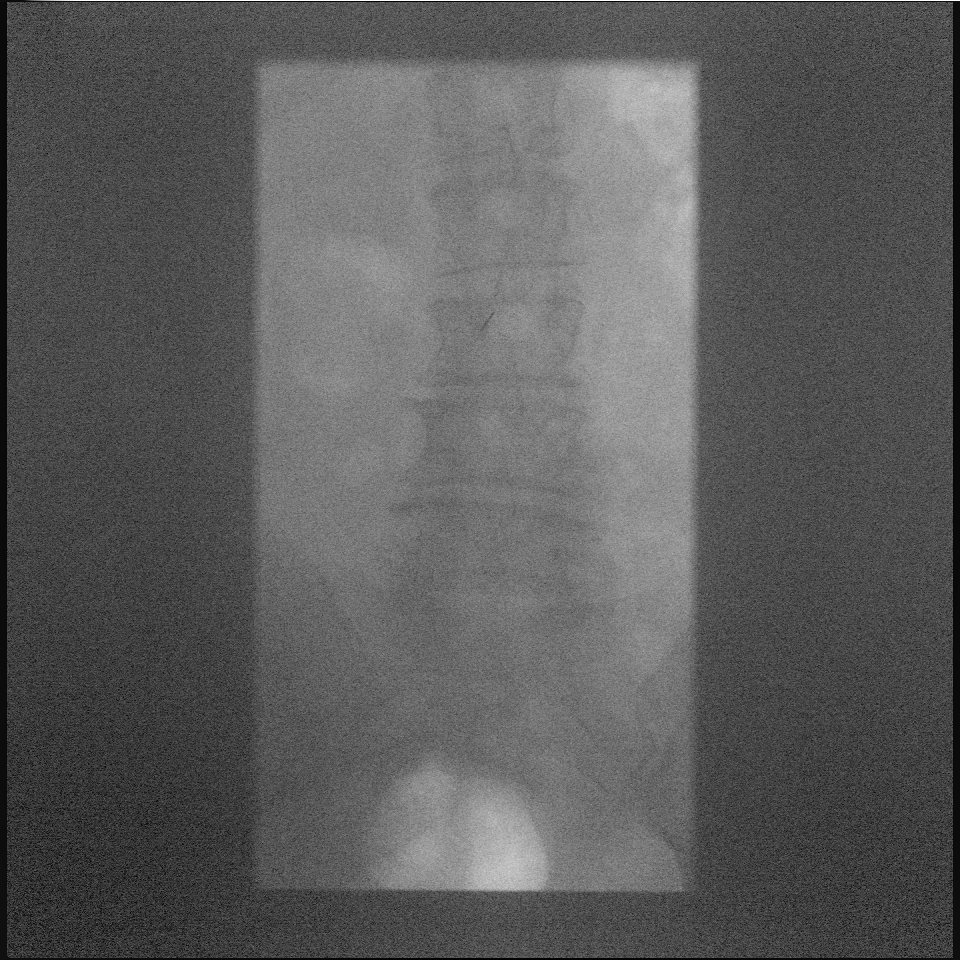

[3 of 3 positions shown; findings below may reference images not displayed]

EXAM:
DIAGNOSTIC LUMBAR PUNCTURE UNDER FLUOROSCOPIC GUIDANCE

FLUOROSCOPY TIME:  Radiation Exposure Index (as provided by the
fluoroscopic device): 11.5 mGy

If the device does not provide the exposure index:

Fluoroscopy Time (in minutes and seconds):  2.5 mGy

Number of Acquired Images:  2

PROCEDURE:
Informed consent was obtained from the patient prior to the
procedure, including potential complications of headache, allergy,
and pain. With the patient prone, the lower back was prepped with
Betadine. 1% Lidocaine was used for local anesthesia. Lumbar
puncture was performed at the L2-3 level using a 22 gauge needle
with return of clear CSF with an opening pressure of 14 cm water. No
fluid was CSF removed. The patient tolerated the procedure well and
there were no apparent complications.
IMPRESSION: Successful fluoroscopic guided lumbar puncture as described above.

## 2018-11-18 MED ORDER — LIDOCAINE HCL (PF) 1 % IJ SOLN
10.0000 mL | Freq: Once | INTRAMUSCULAR | Status: AC
  Administered 2018-11-18: 10 mL
  Filled 2018-11-18: qty 10

## 2018-11-18 MED ORDER — ACETAMINOPHEN 500 MG PO TABS
1000.0000 mg | ORAL_TABLET | Freq: Four times a day (QID) | ORAL | Status: DC | PRN
  Filled 2018-11-18: qty 2

## 2018-11-18 NOTE — Progress Notes (Signed)
Patient back from radiology s/p lumbar puncture.  Patient states "slight headache". Patient lying flat in bed; given snacks;   Awaiting orders from Radiologist.

## 2018-11-19 ENCOUNTER — Emergency Department: Payer: Medicaid Other

## 2018-11-19 ENCOUNTER — Encounter: Payer: Self-pay | Admitting: *Deleted

## 2018-11-19 ENCOUNTER — Other Ambulatory Visit: Payer: Self-pay

## 2018-11-19 ENCOUNTER — Emergency Department
Admission: EM | Admit: 2018-11-19 | Discharge: 2018-11-19 | Disposition: A | Discharge status: Home / Self Care | Payer: Medicaid Other | Attending: Emergency Medicine | Admitting: Emergency Medicine

## 2018-11-19 DIAGNOSIS — R21 Rash and other nonspecific skin eruption: Secondary | ICD-10-CM | POA: Insufficient documentation

## 2018-11-19 DIAGNOSIS — T782XXA Anaphylactic shock, unspecified, initial encounter: Secondary | ICD-10-CM | POA: Diagnosis not present

## 2018-11-19 DIAGNOSIS — R112 Nausea with vomiting, unspecified: Secondary | ICD-10-CM | POA: Diagnosis not present

## 2018-11-19 DIAGNOSIS — Z87891 Personal history of nicotine dependence: Secondary | ICD-10-CM | POA: Diagnosis not present

## 2018-11-19 DIAGNOSIS — I10 Essential (primary) hypertension: Secondary | ICD-10-CM | POA: Insufficient documentation

## 2018-11-19 DIAGNOSIS — R197 Diarrhea, unspecified: Secondary | ICD-10-CM | POA: Diagnosis not present

## 2018-11-19 DIAGNOSIS — T7840XA Allergy, unspecified, initial encounter: Secondary | ICD-10-CM | POA: Diagnosis present

## 2018-11-19 DIAGNOSIS — Z79899 Other long term (current) drug therapy: Secondary | ICD-10-CM | POA: Insufficient documentation

## 2018-11-19 DIAGNOSIS — R0602 Shortness of breath: Secondary | ICD-10-CM | POA: Insufficient documentation

## 2018-11-19 DIAGNOSIS — Z7982 Long term (current) use of aspirin: Secondary | ICD-10-CM | POA: Diagnosis not present

## 2018-11-19 LAB — CBC WITH DIFFERENTIAL/PLATELET
Abs Immature Granulocytes: 0.09 10*3/uL — ABNORMAL HIGH (ref 0.00–0.07)
Basophils Absolute: 0 10*3/uL (ref 0.0–0.1)
Basophils Relative: 0 %
Eosinophils Absolute: 0 10*3/uL (ref 0.0–0.5)
Eosinophils Relative: 0 %
HCT: 46.5 % — ABNORMAL HIGH (ref 36.0–46.0)
Hemoglobin: 15.1 g/dL — ABNORMAL HIGH (ref 12.0–15.0)
Immature Granulocytes: 1 %
Lymphocytes Relative: 7 %
Lymphs Abs: 1 10*3/uL (ref 0.7–4.0)
MCH: 30.6 pg (ref 26.0–34.0)
MCHC: 32.5 g/dL (ref 30.0–36.0)
MCV: 94.1 fL (ref 80.0–100.0)
Monocytes Absolute: 0.6 10*3/uL (ref 0.1–1.0)
Monocytes Relative: 4 %
Neutro Abs: 13.7 10*3/uL — ABNORMAL HIGH (ref 1.7–7.7)
Neutrophils Relative %: 88 %
Platelets: 202 10*3/uL (ref 150–400)
RBC: 4.94 MIL/uL (ref 3.87–5.11)
RDW: 13.2 % (ref 11.5–15.5)
WBC: 15.5 10*3/uL — ABNORMAL HIGH (ref 4.0–10.5)
nRBC: 0 % (ref 0.0–0.2)

## 2018-11-19 LAB — BASIC METABOLIC PANEL
Anion gap: 9 (ref 5–15)
BUN: 9 mg/dL (ref 6–20)
CO2: 22 mmol/L (ref 22–32)
Calcium: 9.4 mg/dL (ref 8.9–10.3)
Chloride: 104 mmol/L (ref 98–111)
Creatinine, Ser: 0.59 mg/dL (ref 0.44–1.00)
GFR calc Af Amer: >60 mL/min (ref >60)
GFR calc non Af Amer: >60 mL/min (ref >60)
Glucose, Bld: 177 mg/dL — ABNORMAL HIGH (ref 70–99)
Potassium: 3.5 mmol/L (ref 3.5–5.1)
Sodium: 135 mmol/L (ref 135–145)

## 2018-11-19 IMAGING — DX DG CHEST 1V PORT
1 series · 1 of 1 positions shown · non-contrast
Comparison: [DATE]

CLINICAL DATA: Shortness of breath

EXAM:
PORTABLE CHEST 1 VIEW

[chest ap]
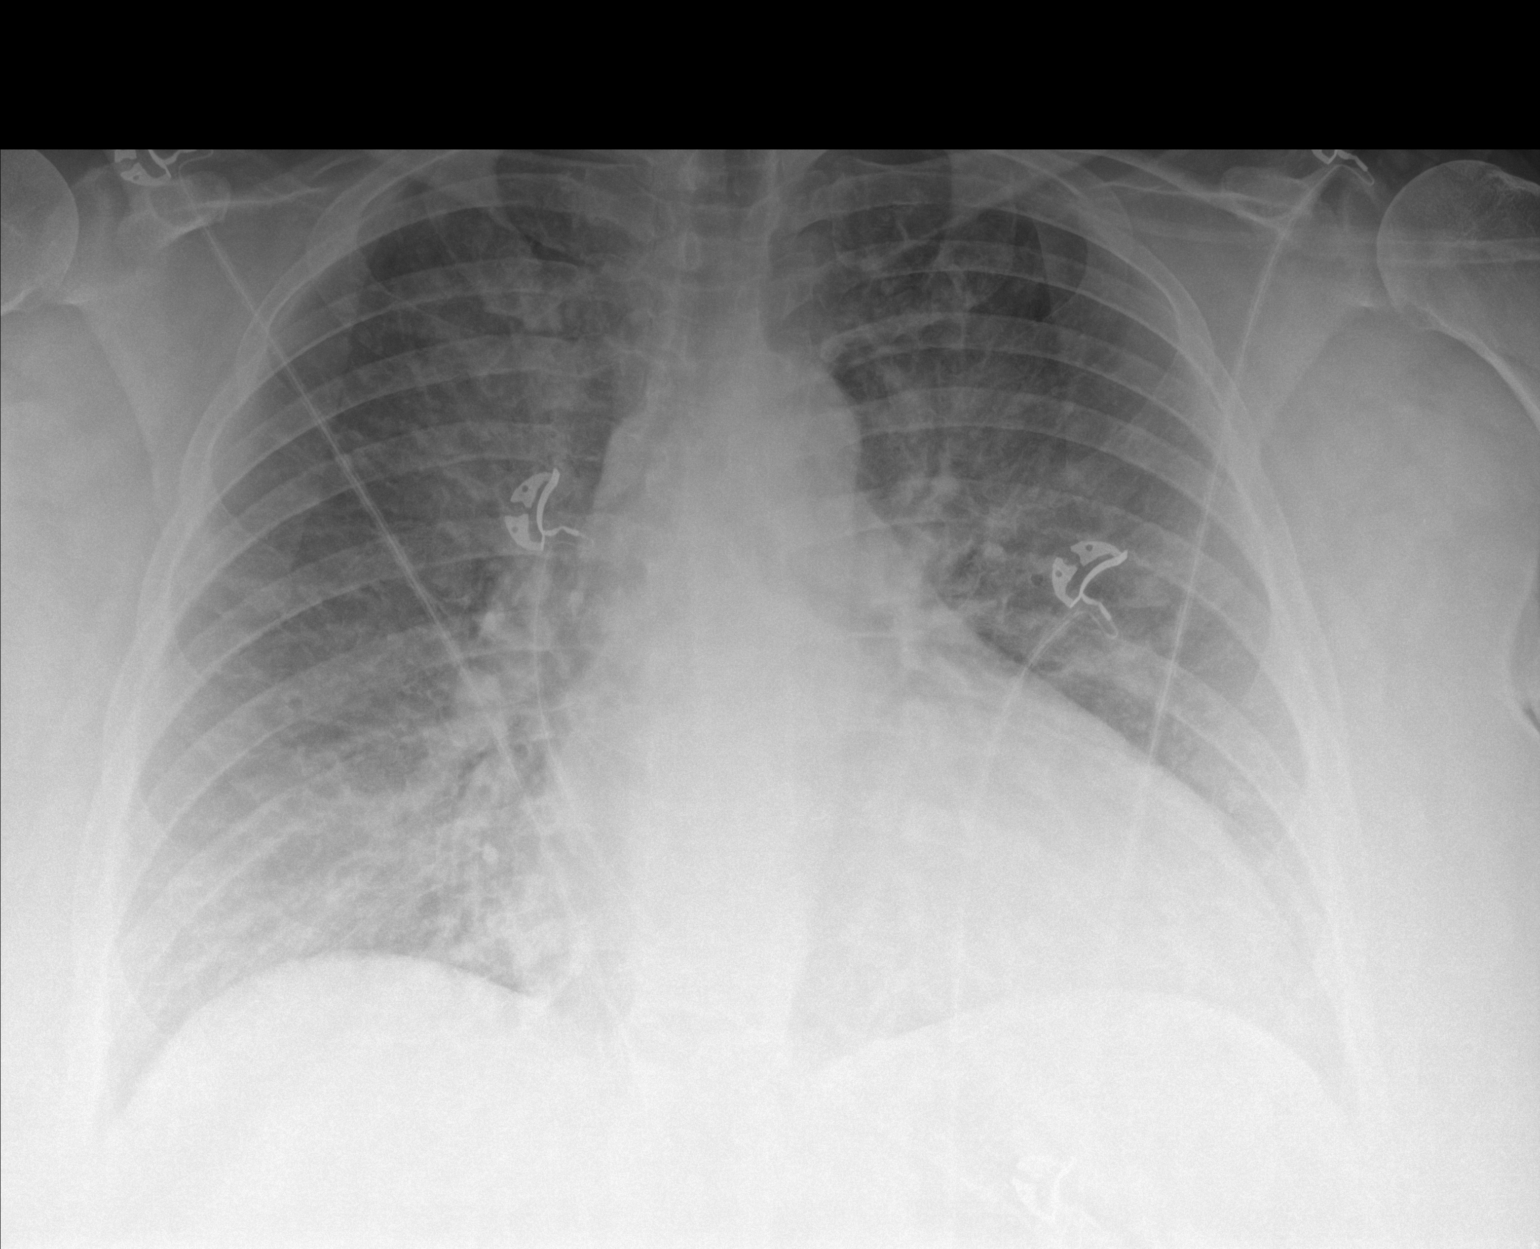

[1 of 1 positions shown; findings below may reference images not displayed]

FINDINGS: There is cardiomegaly with pulmonary venous hypertension. There is
no edema or consolidation. No pleural effusions. No adenopathy. No
bone lesions.
IMPRESSION: Cardiomegaly with pulmonary vascular congestion. No frank edema or
consolidation.

## 2018-11-19 MED ORDER — DIPHENHYDRAMINE HCL 25 MG PO TABS: 50.0000 mg | ORAL_TABLET | Freq: Four times a day (QID) | ORAL | 0 refills | Status: DC

## 2018-11-19 MED ORDER — EPINEPHRINE 0.3 MG/0.3ML IJ SOAJ: 0.3000 mg | Freq: Once | INTRAMUSCULAR | 1 refills | Status: AC

## 2018-11-19 MED ORDER — PREDNISONE 20 MG PO TABS: 40.0000 mg | ORAL_TABLET | Freq: Every day | ORAL | 0 refills | Status: AC

## 2018-11-19 MED ORDER — IPRATROPIUM-ALBUTEROL 0.5-2.5 (3) MG/3ML IN SOLN: 3.0000 mL | RESPIRATORY_TRACT | Status: DC | PRN

## 2018-11-19 MED ORDER — FAMOTIDINE IN NACL 20-0.9 MG/50ML-% IV SOLN
20.0000 mg | Freq: Once | INTRAVENOUS | Status: AC
  Administered 2018-11-19: 20 mg via INTRAVENOUS
  Filled 2018-11-19: qty 50

## 2018-11-19 MED ORDER — METHYLPREDNISOLONE SODIUM SUCC 125 MG IJ SOLR
125.0000 mg | Freq: Once | INTRAMUSCULAR | Status: AC
  Administered 2018-11-19: 125 mg via INTRAVENOUS
  Filled 2018-11-19: qty 2

## 2018-11-19 MED ORDER — DIPHENHYDRAMINE HCL 50 MG/ML IJ SOLN
25.0000 mg | Freq: Once | INTRAMUSCULAR | Status: AC
  Administered 2018-11-19: 25 mg via INTRAVENOUS
  Filled 2018-11-19: qty 1

## 2018-11-19 MED ORDER — EPINEPHRINE 0.3 MG/0.3ML IJ SOAJ: 0.3000 mg | INTRAMUSCULAR | 1 refills | Status: AC | PRN

## 2018-11-19 MED ORDER — ONDANSETRON HCL 4 MG/2ML IJ SOLN
4.0000 mg | Freq: Once | INTRAMUSCULAR | Status: AC
  Administered 2018-11-19: 4 mg via INTRAVENOUS
  Filled 2018-11-19: qty 2

## 2018-11-19 NOTE — ED Provider Notes (Signed)
[facility] Emergency Department Provider Note   ____________________________________________   First MD Initiated Contact with Patient 11/19/18 1336     (approximate)  I have reviewed the triage vital signs and the nursing notes.   HISTORY  Chief Complaint Allergic Reaction    HPI Whitney Martinez is a 48 y.o. female with past medical history of hypertension presents to the ED complaining of allergic reaction.  Patient reports that she took a dose of newly prescribed Diamox today around 11 AM.  Shortly afterward, she started noticing itchy red rash to both of her hands.  She then began feeling nauseous, had one episode of vomiting and diarrhea.  She began to feel like her throat was closing up and it was difficult for her to breathe.  EMS was called and gave patient IM epinephrine with improvement.  However she was noted to be hypoxic on room air to the low 80s, subsequently replaced on nonrebreather for transfer to the ED.  Patient additionally given p.o. Benadryl by EMS.  Patient reports she is feeling better upon arrival in the ED, does feel like she is having some ongoing shortness of breath but is much better than before.  She denies any lightheadedness or feeling like she is going to pass out.        Past Medical History:  Diagnosis Date  . Hypertension     There are no active problems to display for this patient.   Past Surgical History:  Procedure Laterality Date  . CESAREAN SECTION    . DILATION AND CURETTAGE OF UTERUS    . TONSILLECTOMY AND ADENOIDECTOMY      Prior to Admission medications   Medication Sig Start Date End Date Taking? Authorizing Provider  acetaZOLAMIDE (DIAMOX) 125 MG tablet Take 125-250 mg by mouth See admin instructions. Take 1 tablet (125mg ) by mouth twice daily for 2 weeks then increase to 2 tablets (250mg ) by mouth twice daily   Yes [provider]  albuterol (VENTOLIN HFA) 108 (90 Base) MCG/ACT inhaler Inhale 2  puffs into the lungs every 6 (six) hours as needed for wheezing or shortness of breath.   Yes [provider]  aspirin EC 81 MG tablet Take 81 mg by mouth daily.   Yes [provider]  Buprenorphine HCl-Naloxone HCl (SUBOXONE) 8-2 MG FILM Place 2.5 Film under the tongue daily.    Yes [provider]  buPROPion (WELLBUTRIN SR) 150 MG 12 hr tablet Take 150 mg by mouth 2 (two) times daily.    Yes [provider]  gabapentin (NEURONTIN) 600 MG tablet Take 600 mg by mouth 3 (three) times daily.  03/27/18  Yes [provider]  lisinopril-hydrochlorothiazide (ZESTORETIC) 20-25 MG tablet Take 1 tablet by mouth daily.   Yes [provider]  venlafaxine XR (EFFEXOR-XR) 150 MG 24 hr capsule Take 150 mg by mouth daily.    Yes [provider]  ciprofloxacin (CIPRO) 500 MG tablet Take 1 tablet (500 mg total) by mouth 2 (two) times daily. Patient not taking: Reported on 11/19/2018 06/02/18   Coral Spikes, DO  doxycycline (VIBRAMYCIN) 100 MG capsule Take 1 capsule (100 mg total) by mouth 2 (two) times daily. Patient not taking: Reported on 11/19/2018 06/02/18   Coral Spikes, DO  EPINEPHrine (EPIPEN 2-PAK) 0.3 mg/0.3 mL IJ SOAJ injection Inject 0.3 mLs (0.3 mg total) into the muscle as needed for anaphylaxis. 11/19/18   Blake Divine, MD    Allergies Cefdinir  Family  History  Problem Relation Age of Onset  . Hypertension Mother   . Kidney cancer Mother   . Diabetes Father   . Hypertension Father   . Depression Father   . Bipolar disorder Father   . Breast cancer Neg Hx     Social History Social History   Tobacco Use  . Smoking status: Former Games developermoker  . Smokeless tobacco: Never Used  Substance Use Topics  . Alcohol use: Yes    Comment: rarely  . Drug use: Never    Review of Systems  Constitutional: No fever/chills Eyes: No visual changes. ENT: No sore throat. Cardiovascular: Denies chest pain. Respiratory: Positive for shortness  of breath. Gastrointestinal: No abdominal pain.  Positive for nausea, vomiting, and diarrhea.  No constipation. Genitourinary: Negative for dysuria. Musculoskeletal: Negative for back pain. Skin: Positive for rash. Neurological: Negative for headaches, focal weakness or numbness.  ____________________________________________   PHYSICAL EXAM:  VITAL SIGNS: ED Triage Vitals  Enc Vitals Group     BP      Pulse      Resp      Temp      Temp src      SpO2      Weight      Height      Head Circumference      Peak Flow      Pain Score      Pain Loc      Pain Edu?      Excl. in GC?     Constitutional: Alert and oriented. Eyes: Conjunctivae are normal. Head: Atraumatic. Nose: No congestion/rhinnorhea. Mouth/Throat: Mucous membranes are moist. Neck: Normal ROM Cardiovascular: Tachycardic, regular rhythm. Grossly normal heart sounds. Respiratory: Normal respiratory effort.  No retractions.  Inspiratory and expiratory wheezing. Gastrointestinal: Soft and nontender. No distention. Genitourinary: deferred Musculoskeletal: No lower extremity tenderness nor edema. Neurologic:  Normal speech and language. No gross focal neurologic deficits are appreciated. Skin:  Skin is warm, dry and intact. No rash noted. Psychiatric: Mood and affect are normal. Speech and behavior are normal.  ____________________________________________   LABS (all labs ordered are listed, but only abnormal results are displayed)  Labs Reviewed  BASIC METABOLIC PANEL - Abnormal; Notable for the following components:      Result Value   Glucose, Bld 177 (*)    All other components within normal limits  CBC WITH DIFFERENTIAL/PLATELET - Abnormal; Notable for the following components:   WBC 15.5 (*)    Hemoglobin 15.1 (*)    HCT 46.5 (*)    Neutro Abs 13.7 (*)    Abs Immature Granulocytes 0.09 (*)    All other components within normal limits  CBC WITH DIFFERENTIAL/PLATELET    ____________________________________________  EKG  ED ECG REPORT I, Chesley Noonharles Sheron Tallman, the attending physician, personally viewed and interpreted this ECG.   Date: 11/19/2018  EKG Time: 13:48  Rate: 110  Rhythm: sinus tachycardia  Axis: Normal  Intervals:none  ST&T Change: None    PROCEDURES  Procedure(s) performed (including Critical Care):  .Critical Care Performed by: Chesley NoonJessup, Evin Loiseau, MD Authorized by: Chesley NoonJessup, Emori Mumme, MD   Critical care provider statement:    Critical care time (minutes):  45   Critical care time was exclusive of:  Separately billable procedures and treating other patients and teaching time   Critical care was necessary to treat or prevent imminent or life-threatening deterioration of the following conditions:  Respiratory failure   Critical care was time spent personally by me on the following activities:  Discussions with consultants, evaluation of patient's response to treatment, examination of patient, ordering and performing treatments and interventions, ordering and review of laboratory studies, ordering and review of radiographic studies, pulse oximetry, re-evaluation of patient's condition, obtaining history from patient or surrogate and review of old charts   I assumed direction of critical care for this patient from another provider in my specialty: no       ____________________________________________   INITIAL IMPRESSION / ASSESSMENT AND PLAN / ED COURSE       48 year old female presents to the ED with acute onset itchy red rash, GI symptoms, and shortness of breath following her second dose of newly prescribed Diamox.  Appears consistent with anaphylactic reaction, patient received IM epinephrine by EMS with improvement in symptoms.  She does continue to require oxygen upon arrival in the ED, although blood pressure remained stable.  We will hold off on additional epinephrine for now, but given wheezing will give breathing treatments.   Additionally, will treat with IV Benadryl, steroids, and Pepcid.  Patient does report longstanding history of smoking with recent referral to pulmonology, likely has underlying undiagnosed COPD.  Will check labs and chest x-ray.  Patient was able to be weaned off of nonrebreather to 6 L nasal cannula, will continue to monitor.  Patient turned over to Dr. Scotty Court pending further observation and improvement in hypoxia.  If patient continues to remain hypoxic, she would likely require admission for further management.      ____________________________________________   FINAL CLINICAL IMPRESSION(S) / ED DIAGNOSES  Final diagnoses:  Anaphylaxis, initial encounter     ED Discharge Orders         Ordered    EPINEPHrine (EPIPEN 2-PAK) 0.3 mg/0.3 mL IJ SOAJ injection  As needed     11/19/18 1500           Note:  This document was prepared using Dragon voice recognition software and may include unintentional dictation errors.   Chesley Noon, MD 11/19/18 [phone]

## 2018-11-19 NOTE — ED Provider Notes (Signed)
Procedures     ----------------------------------------- 5:05 PM on 11/19/2018 -----------------------------------------   Patient completely asymptomatic.  Breathing comfortably, vital signs unremarkable.  Oxygenation in the low 90s on room air.  No stridor, no wheezing, no cough or prolongation of expiratory phase or inducible wheezing with FEV1 maneuver.  Offered overnight observation given the severity of presentation and 2 doses of epi, but patient refuses.  It is her birthday and she is eager to go home and celebrate with her family.  Return precautions discussed, she agrees to return by ambulance if she has any worsening symptoms.  Prescribed 3 days of Benadryl and prednisone as well as EpiPen for her to have on hand.  Final diagnoses:  Anaphylaxis, initial encounter      Carrie Mew, MD 11/19/18 [phone]

## 2018-11-19 NOTE — ED Triage Notes (Signed)
Pt to ED via EMS reporting feeling as though an allergic reaction started at 1130 and worsened until EMS intervention. Pt had a spinal tap performed yesterday to test intracranial pressures and was placed on Diamox. Pts third dose was today and 45 minutes after taking pt started having itching in her pals, swelling in her throat and difficulty breathing. Pt was 80% on RA upon EMS arrival. Pt is 94% on NRB upon arrival to ED. Pt able to speak in broken sentences but EMS reports the WOB, skin color and diaphoresis have improved significantly.   EMS administered a total of 50mg  Benadryl and 0.6 mg IM Epi.

## 2018-11-19 NOTE — ED Notes (Signed)
Pt placed on 6L [place] at this time.  

## 2018-11-19 NOTE — ED Notes (Signed)
Pt has excessive sweating at this time. Temperature rechecked. 98.8 oral. Pt states this is normal due to menopausal hot flashes.

## 2019-01-21 ENCOUNTER — Other Ambulatory Visit: Payer: Self-pay | Admitting: Cardiology

## 2019-01-21 DIAGNOSIS — R0602 Shortness of breath: Secondary | ICD-10-CM

## 2019-01-31 ENCOUNTER — Ambulatory Visit: Admission: RE | Admit: 2019-01-31 | Payer: Medicaid Other | Source: Ambulatory Visit

## 2019-05-21 ENCOUNTER — Other Ambulatory Visit: Payer: Self-pay | Admitting: Neurology

## 2019-05-21 DIAGNOSIS — G932 Benign intracranial hypertension: Secondary | ICD-10-CM

## 2019-05-23 ENCOUNTER — Other Ambulatory Visit: Payer: Self-pay | Admitting: Neurology

## 2019-05-23 DIAGNOSIS — R633 Feeding difficulties, unspecified: Secondary | ICD-10-CM

## 2019-05-23 DIAGNOSIS — R1310 Dysphagia, unspecified: Secondary | ICD-10-CM

## 2019-05-28 ENCOUNTER — Ambulatory Visit: Payer: Medicaid Other | Attending: Internal Medicine

## 2019-05-30 ENCOUNTER — Ambulatory Visit: Payer: Medicaid Other | Attending: Internal Medicine

## 2019-06-03 ENCOUNTER — Ambulatory Visit: Admission: RE | Admit: 2019-06-03 | Payer: Medicaid Other | Source: Ambulatory Visit

## 2019-06-04 ENCOUNTER — Ambulatory Visit
Admission: RE | Admit: 2019-06-04 | Discharge: 2019-06-04 | Disposition: A | Discharge status: Home / Self Care | Payer: Medicaid Other | Source: Ambulatory Visit | Attending: Neurology | Admitting: Neurology

## 2019-06-04 ENCOUNTER — Other Ambulatory Visit: Payer: Self-pay

## 2019-06-04 ENCOUNTER — Ambulatory Visit: Payer: Medicaid Other

## 2019-06-04 DIAGNOSIS — G932 Benign intracranial hypertension: Secondary | ICD-10-CM | POA: Insufficient documentation

## 2019-06-04 LAB — POCT I-STAT CREATININE: Creatinine, Ser: 0.5 mg/dL (ref 0.44–1.00)

## 2019-06-04 IMAGING — MR MR HEAD WO/W CM
15 series · 48 of 48 positions shown · IV contrast (gadavist)
Comparison: None.

CLINICAL DATA: Chronic headaches, memory loss, pressure behind
eyes, dizziness

EXAM:
MRI HEAD WITHOUT AND WITH CONTRAST
TECHNIQUE: Multiplanar, multiecho pulse sequences of the brain and surrounding
structures were obtained without and with intravenous contrast.
CONTRAST:  5mL GADAVIST GADOBUTROL 1 MMOL/ML IV SOLN

[Series 5: ax dwi_tracew · axial · 3.0mm · 0.60mm/px · z∈[-83,+64]mm · 2 of 48 slices shown]
[im 1/48]
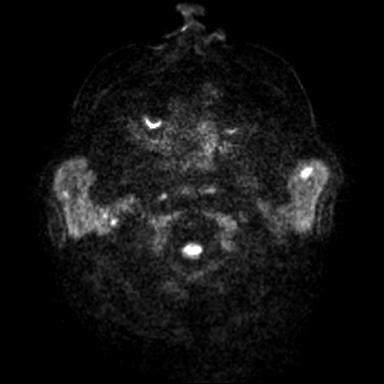
[im 48/48]
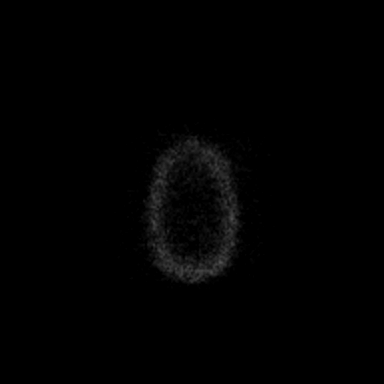

[Series 6: ax dwi_adc · axial · 3.0mm · 0.60mm/px · z∈[-83,+64]mm · 2 of 48 slices shown]
[im 1/48]
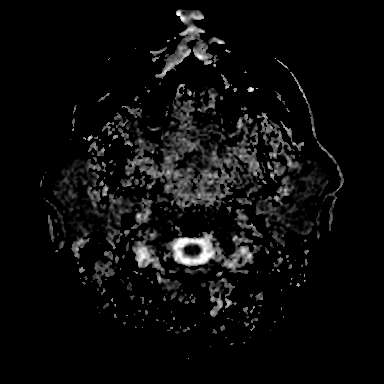
[im 48/48]
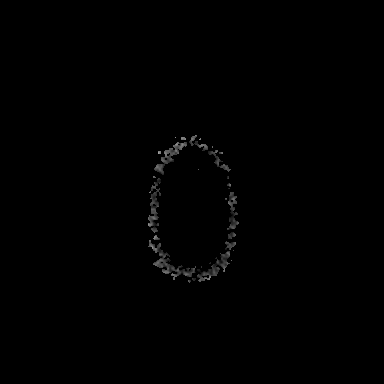

[Series 7: cor dwi_tracew · coronal · 5.0mm · 0.60mm/px · 2 of 36 slices shown]
[im 1/36]
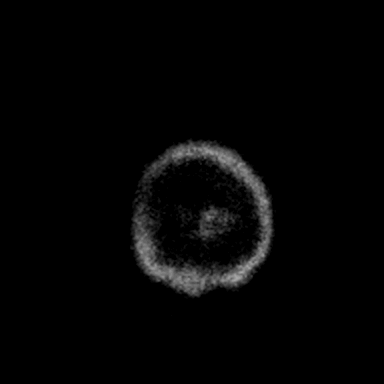
[im 36/36]
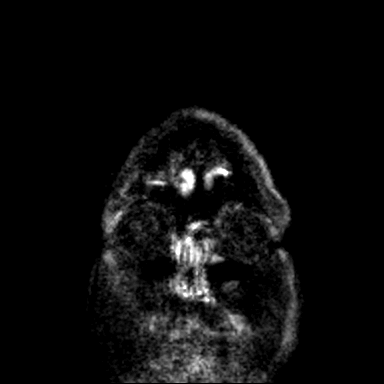

[Series 8: cor dwi_adc · coronal · 5.0mm · 0.60mm/px · 2 of 36 slices shown]
[im 1/36]
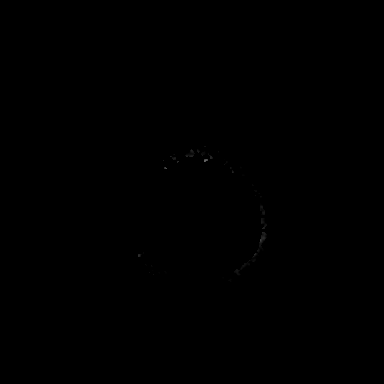
[im 36/36]
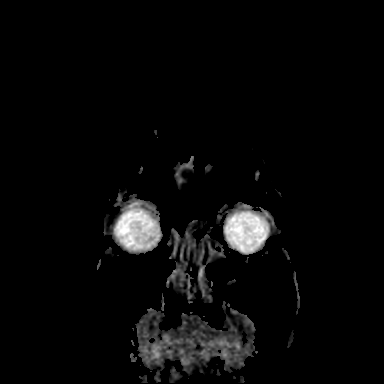

[Series 9: T1 · sagittal · 5.0mm · 0.62mm/px · 1 of 21 slices shown (1 of 2)]
[im 1/21]
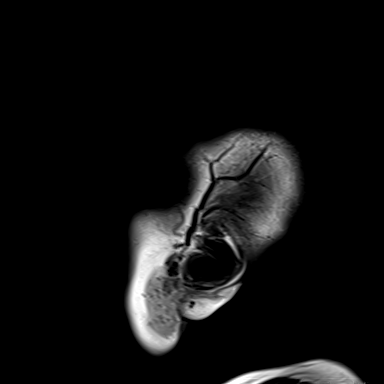

[Series 10: T2 · axial · 5.0mm · 0.53mm/px · 1 of 25 slices shown]
[im 1/25]
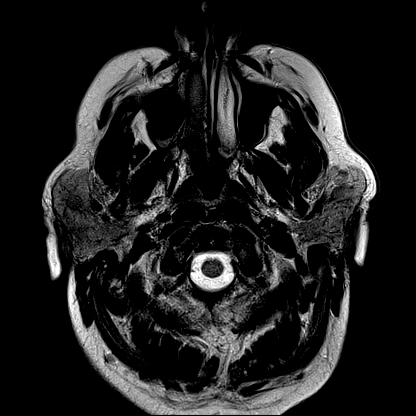

[Series 11: mag_images · axial · 3.0mm · 0.90mm/px · z∈[-93,+75]mm · 3 of 60 slices shown]
[im 1/60]
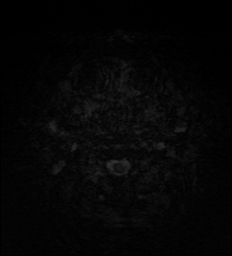
[im 30/60]
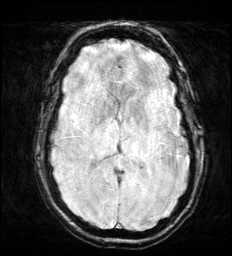
[im 60/60]
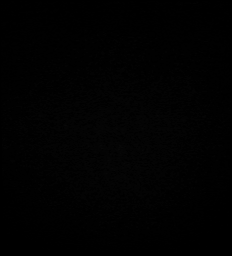

[Series 12: pha_images · axial · 3.0mm · 0.90mm/px · z∈[-87,+75]mm · 3 of 57 slices shown]
[im 1/57]
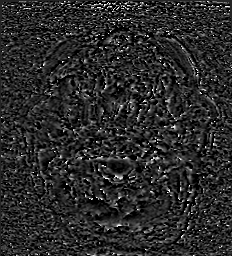
[im 29/57]
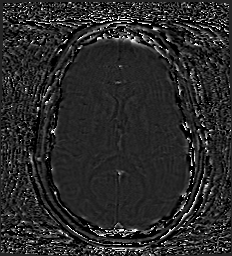
[im 57/57]
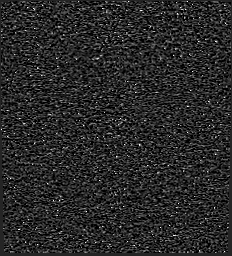

[Series 13: swi_images · axial · 3.0mm · 0.90mm/px · z∈[-93,+75]mm · 3 of 60 slices shown]
[im 1/60]
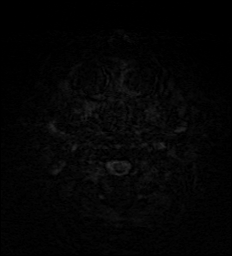
[im 30/60]
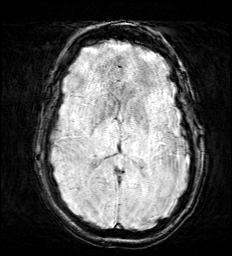
[im 60/60]
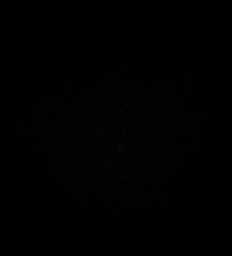

[Series 15: FLAIR · axial · 3.0mm · 0.53mm/px · z∈[-87,+66]mm · 3 of 55 slices shown]
[im 1/55]
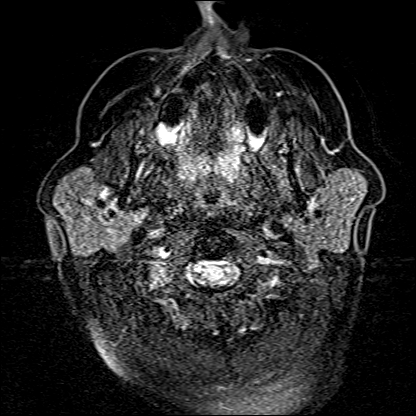
[im 28/55]
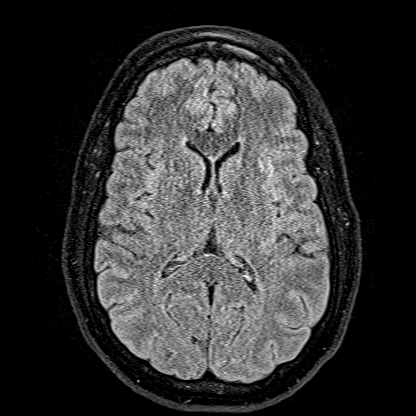
[im 55/55]
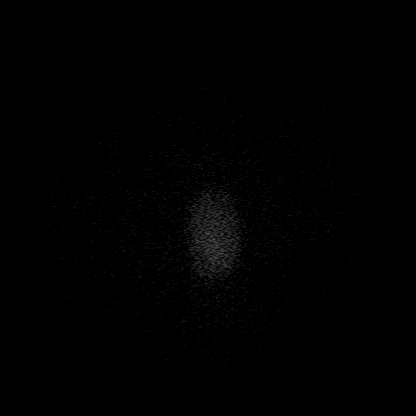

[Series 16: T1 · axial · 1.0mm · 0.98mm/px · z∈[-81,+69]mm · 8 of 160 slices shown (2 of 2)]
[im 1/160]
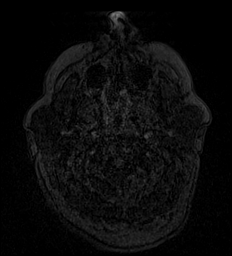
[im 23/160]
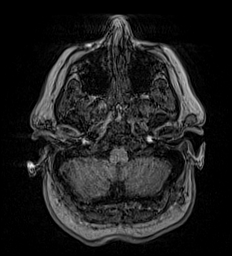
[im 46/160]
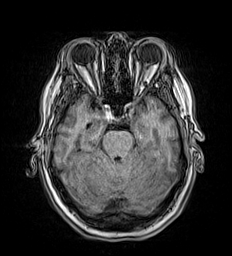
[im 69/160]
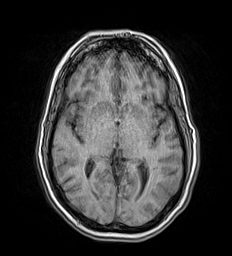
[im 91/160]
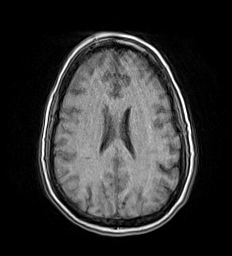
[im 114/160]
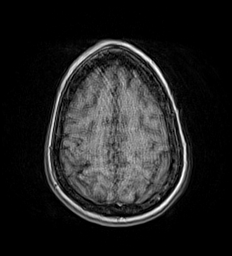
[im 137/160]
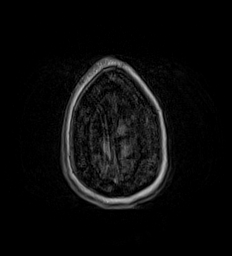
[im 160/160]
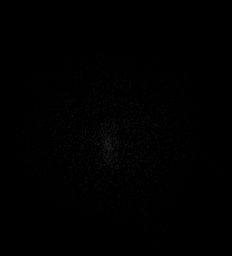

[Series 17: T2 post-contrast · coronal · 5.0mm · 0.57mm/px · 1 of 29 slices shown]
[im 1/29]
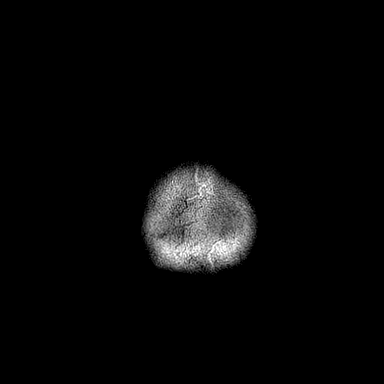

[Series 18: T1 post-contrast · axial · 1.0mm · 0.98mm/px · z∈[-81,+69]mm · 8 of 160 slices shown (1 of 3)]
[im 1/160]
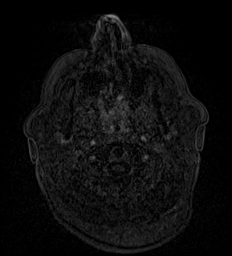
[im 23/160]
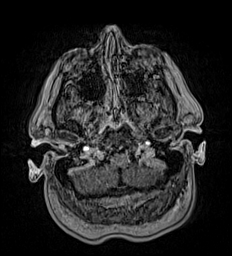
[im 46/160]
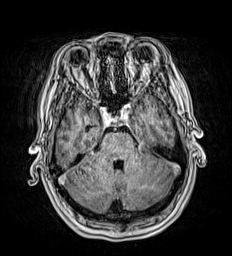
[im 69/160]
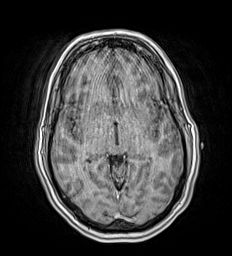
[im 91/160]
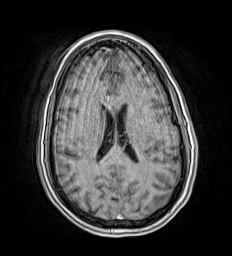
[im 114/160]
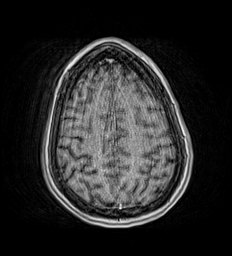
[im 137/160]
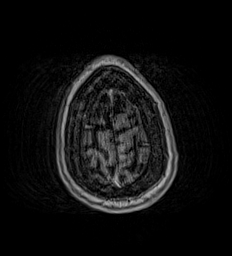
[im 160/160]
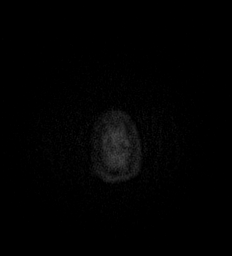

[Series 19: T1 post-contrast · coronal · 5.0mm · 0.57mm/px · 1 of 29 slices shown (2 of 3)]
[im 1/29]
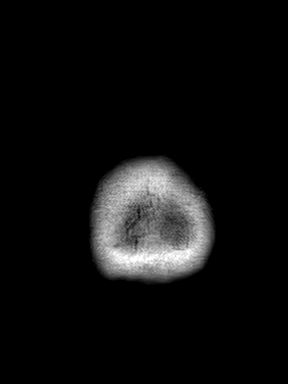

[Series 20: T1 post-contrast · axial · 1.0mm · 0.98mm/px · z∈[-81,+69]mm · 8 of 160 slices shown (3 of 3)]
[im 1/160]
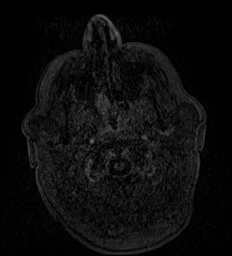
[im 23/160]
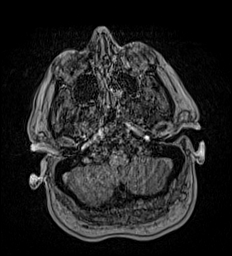
[im 46/160]
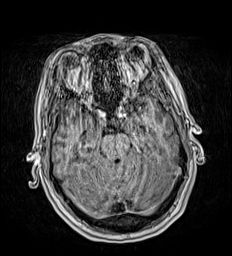
[im 69/160]
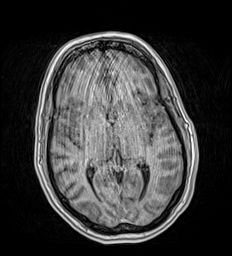
[im 91/160]
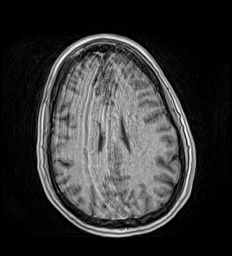
[im 114/160]
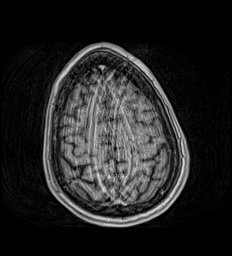
[im 137/160]
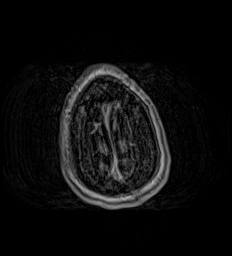
[im 160/160]
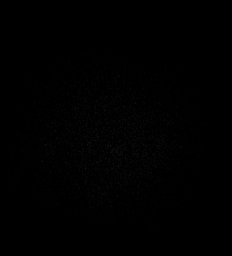

[48 of 48 positions shown; findings below may reference images not displayed]

FINDINGS: Motion artifact is present.

Brain: There is no acute infarction or intracranial hemorrhage.
There is no intracranial mass, mass effect, or edema. There is no
hydrocephalus or extra-axial fluid collection. No abnormal
enhancement, noting that contrast dosage reduced due to issue with
intravenous line after injection of partial dose. Delayed imaging
was obtained to increase sensitivity.

Vascular: Major vessel flow voids at the skull base are preserved.

Skull and upper cervical spine: Normal marrow signal is preserved.

Sinuses/Orbits: Minor mucosal thickening.  Orbits are unremarkable.

Other: Sella is unremarkable. Minor right mastoid fluid
opacification.
IMPRESSION: Suboptimal evaluation due to motion artifact. No evidence of recent
infarction, hemorrhage, or mass.

## 2019-06-04 MED ORDER — GADOBUTROL 1 MMOL/ML IV SOLN
5.0000 mL | Freq: Once | INTRAVENOUS | Status: AC | PRN
  Administered 2019-06-04: 16:00:00 5 mL via INTRAVENOUS

## 2019-06-13 ENCOUNTER — Ambulatory Visit: Admission: RE | Admit: 2019-06-13 | Payer: Medicaid Other | Source: Ambulatory Visit

## 2019-06-19 ENCOUNTER — Encounter: Payer: Self-pay | Admitting: Emergency Medicine

## 2019-06-19 ENCOUNTER — Ambulatory Visit
Admission: EM | Admit: 2019-06-19 | Discharge: 2019-06-19 | Disposition: A | Discharge status: Home / Self Care | Payer: Medicaid Other | Attending: Emergency Medicine | Admitting: Emergency Medicine

## 2019-06-19 ENCOUNTER — Other Ambulatory Visit: Payer: Self-pay

## 2019-06-19 DIAGNOSIS — K047 Periapical abscess without sinus: Secondary | ICD-10-CM

## 2019-06-19 DIAGNOSIS — L03211 Cellulitis of face: Secondary | ICD-10-CM

## 2019-06-19 HISTORY — DX: Polyneuropathy, unspecified: G62.9

## 2019-06-19 MED ORDER — AMOXICILLIN-POT CLAVULANATE 875-125 MG PO TABS: 1.0000 | ORAL_TABLET | Freq: Two times a day (BID) | ORAL | 0 refills | Status: AC

## 2019-06-19 MED ORDER — CEFTRIAXONE SODIUM 1 G IJ SOLR
1.0000 g | Freq: Once | INTRAMUSCULAR | Status: AC
  Administered 2019-06-19: 14:00:00 1 g via INTRAMUSCULAR

## 2019-06-19 NOTE — Discharge Instructions (Addendum)
600 mg of ibuprofen combined with 1000 mg of Tylenol 3-4 times a day as needed for pain.  Salt water rinses may help.  Finish the Augmentin unless the medical provider tells you to stop.  Go immediately to the ER for the signs and symptoms we discussed.

## 2019-06-19 NOTE — ED Triage Notes (Signed)
Patient in today c/o 2 day history of dental pain.

## 2019-06-19 NOTE — ED Provider Notes (Signed)
HPI  SUBJECTIVE:  Whitney Martinez is a 49 y.o. female who presents with 2 days of a dental infection on her bottom front teeth.  She reports swollen tender submental and cervical lymphadenopathy, sore throat, chin tenderness, erythema, fevers T-max 101.4.  She reports swelling underneath her face.  Denies swelling underneath her tongue.  She reports soreness and tenderness underneath her tongue.  She has a history of frequent dental infections which turn into facial cellulitis and states this feels identical to that.  She denies sensation of throat swelling shut, voice changes, difficulty breathing, drooling, trismus, neck stiffness.  She has been taking ibuprofen 60 mg with improvement in her symptoms.  Symptoms are worse with palpation, talking.  Last dose of ibuprofen was within 4 to 6 hours of evaluation.  She has a past medical history of Ludwicg's angina, MRSA, hypertension on an ACE inhibitor.  She denies angioedema of the lips or tongue.  She has has a history of fibromyalgia, chronic pain, currently on Suboxone.  PMD: [facility].  Dentist: [employer].  Has an appointment with him on Tuesday.    Past Medical History:  Diagnosis Date  . Hypertension   . Neuropathy     Past Surgical History:  Procedure Laterality Date  . CESAREAN SECTION    . DILATION AND CURETTAGE OF UTERUS    . TONSILLECTOMY AND ADENOIDECTOMY      Family History  Problem Relation Age of Onset  . Hypertension Mother   . Kidney cancer Mother   . Diabetes Father   . Hypertension Father   . Depression Father   . Bipolar disorder Father   . Breast cancer Neg Hx     Social History   Tobacco Use  . Smoking status: Former Smoker    Quit date: 10/2018    Years since quitting: 0.7  . Smokeless tobacco: Never Used  Substance Use Topics  . Alcohol use: Yes    Comment: rarely  . Drug use: Never     Current Facility-Administered Medications:  .  cefTRIAXone (ROCEPHIN) injection 1 g, 1 g,  Intramuscular, Once, Domenick Gong, MD  Current Outpatient Medications:  .  albuterol (VENTOLIN HFA) 108 (90 Base) MCG/ACT inhaler, Inhale 2 puffs into the lungs every 6 (six) hours as needed for wheezing or shortness of breath., Disp: , Rfl:  .  aspirin EC 81 MG tablet, Take 81 mg by mouth daily., Disp: , Rfl:  .  Buprenorphine HCl-Naloxone HCl (SUBOXONE) 8-2 MG FILM, Place 2.5 Film under the tongue daily. , Disp: , Rfl:  .  EPINEPHrine (EPIPEN 2-PAK) 0.3 mg/0.3 mL IJ SOAJ injection, Inject 0.3 mLs (0.3 mg total) into the muscle as needed for anaphylaxis., Disp: 1 each, Rfl: 1 .  gabapentin (NEURONTIN) 600 MG tablet, Take 600 mg by mouth 3 (three) times daily. , Disp: , Rfl:  .  lisinopril-hydrochlorothiazide (ZESTORETIC) 20-25 MG tablet, Take 1 tablet by mouth daily., Disp: , Rfl:  .  PROVIGIL 200 MG tablet, Take 200 mg by mouth daily., Disp: , Rfl:  .  venlafaxine XR (EFFEXOR-XR) 150 MG 24 hr capsule, Take 150 mg by mouth daily. , Disp: , Rfl:  .  amoxicillin-clavulanate (AUGMENTIN) 875-125 MG tablet, Take 1 tablet by mouth 2 (two) times daily for 7 days., Disp: 14 tablet, Rfl: 0  Allergies  Allergen Reactions  . Cefdinir Anaphylaxis, Hives and Swelling  . Diamox [Acetazolamide] Anaphylaxis     ROS  As noted in HPI.   Physical Exam  BP (!) 156/82 (BP Location: Left Arm)   Pulse 84   Temp 98.2 F (36.8 C) (Oral)   Resp 18   Ht 5\' 9"  (1.753 m)   Wt (!) 152 kg   LMP 03/21/2019 (Approximate)   SpO2 98%   BMI 49.47 kg/m   Constitutional: Well developed, well nourished, no acute distress Eyes:  EOMI, conjunctiva normal bilaterally HENT: Normocephalic, atraumatic,mucus membranes moist extensive dental decay.  Tooth 26 -23 decayed, tender to palpation.  Positive surrounding gingival swelling where teeth are absent.  Positive swelling, induration anterior to these teeth on the gumline and inner lip.  Positive tenderness over the chin.  No drooling trismus stridor.  Positive  tenderness underneath the tongue.  No induration under the tongue or under the jaw.  No obvious asymmetric facial swelling or swelling underneath the jaw. Lymph: Positive tender submental, cervical lymphadenopathy Respiratory: Normal inspiratory effort Cardiovascular: Normal rate GI: nondistended skin: No rash, skin intact Musculoskeletal: no deformities Neurologic: Alert & oriented x 3, no focal neuro deficits Psychiatric: Speech and behavior appropriate   ED Course   Medications  cefTRIAXone (ROCEPHIN) injection 1 g (has no administration in time range)    No orders of the defined types were placed in this encounter.   No results found for this or any previous visit (from the past 24 hour(s)). No results found.  ED Clinical Impression  1. Dental infection   2. Facial cellulitis      ED Assessment/Plan  [place] Narcotic database reviewed for this patient.  Patient on Suboxone.  Patient with a dental infection turning into a facial cellulitis.  There is no evidence of Ludwig's angina today.  Giving 1 g of Rocephin will send home with Augmentin twice a day for 7 days.  Patient states that she has tolerated both of these before without any problem Tylenol/ibuprofen combination, salt water rinses, she has follow-up with her dentist scheduled on Tuesday.  She is to go to the ER for any signs of airway compromise or concerns.  Discussed MDM, treatment plan, and plan for follow-up with patient. Discussed sn/sx that should prompt return to the ED. patient agrees with plan.   Meds ordered this encounter  Medications  . cefTRIAXone (ROCEPHIN) injection 1 g  . amoxicillin-clavulanate (AUGMENTIN) 875-125 MG tablet    Sig: Take 1 tablet by mouth 2 (two) times daily for 7 days.    Dispense:  14 tablet    Refill:  0    *This clinic note was created using [employer]. Therefore, there may be occasional mistakes despite careful proofreading.   ?    Melynda Ripple,  MD 06/19/19 [phone]

## 2019-06-24 ENCOUNTER — Ambulatory Visit: Admission: RE | Admit: 2019-06-24 | Payer: Medicaid Other | Source: Ambulatory Visit

## 2019-07-17 ENCOUNTER — Ambulatory Visit
Admission: EM | Admit: 2019-07-17 | Discharge: 2019-07-17 | Disposition: A | Discharge status: Home / Self Care | Payer: Medicaid Other | Attending: Family Medicine | Admitting: Family Medicine

## 2019-07-17 ENCOUNTER — Other Ambulatory Visit: Payer: Self-pay

## 2019-07-17 DIAGNOSIS — H6690 Otitis media, unspecified, unspecified ear: Secondary | ICD-10-CM

## 2019-07-17 DIAGNOSIS — L03211 Cellulitis of face: Secondary | ICD-10-CM | POA: Diagnosis not present

## 2019-07-17 MED ORDER — CEFTRIAXONE SODIUM 1 G IJ SOLR
1.0000 g | Freq: Once | INTRAMUSCULAR | Status: AC
  Administered 2019-07-17: 1 g via INTRAMUSCULAR

## 2019-07-17 MED ORDER — AMOXICILLIN-POT CLAVULANATE 875-125 MG PO TABS: 1.0000 | ORAL_TABLET | Freq: Two times a day (BID) | ORAL | 0 refills | Status: DC

## 2019-07-17 MED ORDER — MUPIROCIN 2 % EX OINT: 1.0000 "application " | TOPICAL_OINTMENT | Freq: Two times a day (BID) | CUTANEOUS | 0 refills | Status: AC

## 2019-07-17 MED ORDER — SULFAMETHOXAZOLE-TRIMETHOPRIM 800-160 MG PO TABS: 1.0000 | ORAL_TABLET | Freq: Two times a day (BID) | ORAL | 0 refills | Status: AC

## 2019-07-17 NOTE — ED Provider Notes (Signed)
[facility]    CSN: [id number] Arrival date & time: 07/17/19  1651      History   Chief Complaint Chief Complaint  Patient presents with  . Skin Problem   HPI   49 year old female presents with the above complaint.  Patient has a history of recurrent cellulitis.  Patient reports a 4-day history of pain, swelling, and drainage of the right external ear.  She also reports pain and redness of the area of the chin.  Patient has been taking doxycycline for the past 4 days and has not noted any improvement.  She rates her pain a 6/10 in severity.  No relieving factors.  No fever.  Patient is concerned about the lack of improvement and is trying to avoid going to the hospital.  No other complaints at this time.  PMH: Recurrent cellulitis 10/29/2018  Prediabetes 06/21/2018  Multiple joint pain, unspecified 01/29/2018  Depression 07/27/2017  Class 3 severe obesity due to excess calories with body mass index (BMI) of 50.0 to 59.9 in adult 07/27/2017  Tobacco abuse 07/27/2017  Mixed hyperlipidemia 05/14/2017  SOB (shortness of breath) 04/29/2017  Daytime somnolence 04/29/2017  Swelling of both lower extremities 04/29/2017  Fibromyalgia 04/04/2017  Right wrist pain 04/03/2017  Fatty liver 07/04/2016  Facial cellulitis 11/29/2015  Hypertension 05/28/2012  Tobacco dependence     Home Medications    Prior to Admission medications   Medication Sig Start Date End Date Taking? Authorizing Provider  albuterol (VENTOLIN HFA) 108 (90 Base) MCG/ACT inhaler Inhale 2 puffs into the lungs every 6 (six) hours as needed for wheezing or shortness of breath.    [provider]  amoxicillin-clavulanate (AUGMENTIN) 875-125 MG tablet Take 1 tablet by mouth every 12 (twelve) hours. 07/17/19   Tommie Sams, DO  aspirin EC 81 MG tablet Take 81 mg by mouth daily.    [provider]  Buprenorphine HCl-Naloxone HCl (SUBOXONE) 8-2 MG FILM Place 2.5 Film under the tongue daily.      [provider]  EPINEPHrine (EPIPEN 2-PAK) 0.3 mg/0.3 mL IJ SOAJ injection Inject 0.3 mLs (0.3 mg total) into the muscle as needed for anaphylaxis. 11/19/18   Chesley Noon, MD  gabapentin (NEURONTIN) 600 MG tablet Take 600 mg by mouth 3 (three) times daily.  03/27/18   [provider]  lisinopril-hydrochlorothiazide (ZESTORETIC) 20-25 MG tablet Take 1 tablet by mouth daily.    [provider]  mupirocin ointment (BACTROBAN) 2 % Apply 1 application topically 2 (two) times daily for 7 days. 07/17/19 07/24/19  Everlene Other G, DO  PROVIGIL 200 MG tablet Take 200 mg by mouth daily. 06/04/19   [provider]  sulfamethoxazole-trimethoprim (BACTRIM DS) 800-160 MG tablet Take 1 tablet by mouth 2 (two) times daily for 10 days. 07/17/19 07/27/19  Tommie Sams, DO  venlafaxine XR (EFFEXOR-XR) 150 MG 24 hr capsule Take 150 mg by mouth daily.     [provider]  acetaZOLAMIDE (DIAMOX) 125 MG tablet Take 125-250 mg by mouth See admin instructions. Take 1 tablet (125mg ) by mouth twice daily for 2 weeks then increase to 2 tablets (250mg ) by mouth twice daily  06/19/19  [provider]  buPROPion (WELLBUTRIN SR) 150 MG 12 hr tablet Take 150 mg by mouth 2 (two) times daily.   06/19/19  [provider]  diphenhydrAMINE (BENADRYL) 25 MG tablet Take 2 tablets (50 mg total) by mouth every 6 (six) hours for 3 days. 11/19/18 06/19/19  11/21/18, MD  Family History Family History  Problem Relation Age of Onset  . Hypertension Mother   . Kidney cancer Mother   . Diabetes Father   . Hypertension Father   . Depression Father   . Bipolar disorder Father   . Breast cancer Neg Hx     Social History Social History   Tobacco Use  . Smoking status: Former Smoker    Quit date: 10/2018    Years since quitting: 0.7  . Smokeless tobacco: Never Used  Substance Use Topics  . Alcohol use: Yes    Comment: rarely  . Drug use: Never     Allergies    Cefdinir and Diamox [acetazolamide]   Review of Systems Review of Systems Per HPI  Physical Exam Triage Vital Signs ED Triage Vitals  Enc Vitals Group     BP 07/17/19 1659 (!) 144/96     Pulse Rate 07/17/19 1659 83     Resp 07/17/19 1659 19     Temp 07/17/19 1659 98.2 F (36.8 C)     Temp Source 07/17/19 1659 Oral     SpO2 07/17/19 1659 96 %     Weight 07/17/19 1702 (!) 334 lb 14.1 oz (151.9 kg)     Height 07/17/19 1702 5\' 9"  (1.753 m)     Head Circumference --      Peak Flow --      Pain Score 07/17/19 1702 6     Pain Loc --      Pain Edu? --      Excl. in [place]? --    Updated Vital Signs BP (!) 144/96 (BP Location: Right Arm)   Pulse 83   Temp 98.2 F (36.8 C) (Oral)   Resp 19   Ht 5\' 9"  (1.753 m)   Wt (!) 151.9 kg   SpO2 96%   BMI 49.45 kg/m   Visual Acuity Right Eye Distance:   Left Eye Distance:   Bilateral Distance:    Right Eye Near:   Left Eye Near:    Bilateral Near:     Physical Exam Vitals and nursing note reviewed.  Constitutional:      General: She is not in acute distress.    Appearance: Normal appearance. She is obese. She is ill-appearing.  HENT:     Head: Normocephalic and atraumatic.      Comments: Area of erythema at the labeled location.    Ears:      Comments: Area of erythema and edema at the level location.  Area has been draining but there is no current active drainage.  TMs with effusion and dullness bilaterally. Eyes:     General:        Right eye: No discharge.        Left eye: No discharge.     Conjunctiva/sclera: Conjunctivae normal.  Cardiovascular:     Rate and Rhythm: Normal rate and regular rhythm.  Pulmonary:     Effort: Pulmonary effort is normal. No respiratory distress.  Neurological:     Mental Status: She is alert.  Psychiatric:        Mood and Affect: Mood normal.        Behavior: Behavior normal.    UC Treatments / Results  Labs (all labs ordered are listed, but only abnormal results are displayed)  Labs Reviewed - No data to display  EKG   Radiology No results found.  Procedures Procedures (including critical care time)  Medications Ordered in UC Medications  cefTRIAXone (ROCEPHIN) injection  1 g (1 g Intramuscular Given 07/17/19 1727)    Initial Impression / Assessment and Plan / UC Course  I have reviewed the triage vital signs and the nursing notes.  Pertinent labs & imaging results that were available during my care of the patient were reviewed by me and considered in my medical decision making (see chart for details).    49 year old female presents with facial cellulitis and also otitis media.  Patient also has a superficial skin infection of the right ear.  Placing on Augmentin, Bactrim, and Bactroban.  Rocephin given here today.  Final Clinical Impressions(s) / UC Diagnoses   Final diagnoses:  Facial cellulitis  Acute otitis media, unspecified otitis media type     Discharge Instructions     Medications as prescribed.  If you worsen, go to the ER.  Take care  Dr. Adriana Simas    ED Prescriptions    Medication Sig Dispense Auth. Provider   amoxicillin-clavulanate (AUGMENTIN) 875-125 MG tablet Take 1 tablet by mouth every 12 (twelve) hours. 20 tablet Cythia Bachtel G, DO   sulfamethoxazole-trimethoprim (BACTRIM DS) 800-160 MG tablet Take 1 tablet by mouth 2 (two) times daily for 10 days. 20 tablet Patricia Fargo G, DO   mupirocin ointment (BACTROBAN) 2 % Apply 1 application topically 2 (two) times daily for 7 days. 22 g Tommie Sams, DO     PDMP not reviewed this encounter.   Tommie Sams, [place] 07/17/19 1805

## 2019-07-17 NOTE — ED Triage Notes (Signed)
Pt reports 4 days of facial cellulitis and right outer ear infection per patient

## 2019-07-17 NOTE — Discharge Instructions (Signed)
Medications as prescribed. ° °If you worsen, go to the ER. ° °Take care ° °Dr. Harout Scheurich  °

## 2019-08-18 ENCOUNTER — Ambulatory Visit: Payer: Self-pay | Admitting: Family Medicine

## 2019-10-07 ENCOUNTER — Other Ambulatory Visit: Payer: Self-pay | Admitting: Rheumatology

## 2019-10-07 ENCOUNTER — Other Ambulatory Visit (HOSPITAL_COMMUNITY): Payer: Self-pay | Admitting: Rheumatology

## 2019-10-07 DIAGNOSIS — G8929 Other chronic pain: Secondary | ICD-10-CM

## 2019-10-21 ENCOUNTER — Ambulatory Visit: Payer: Medicaid Other

## 2019-12-11 ENCOUNTER — Ambulatory Visit
Admission: RE | Admit: 2019-12-11 | Discharge: 2019-12-11 | Disposition: A | Discharge status: Home / Self Care | Payer: Medicaid Other | Source: Ambulatory Visit | Attending: Neurology | Admitting: Neurology

## 2019-12-11 ENCOUNTER — Other Ambulatory Visit: Payer: Self-pay

## 2019-12-11 DIAGNOSIS — R633 Feeding difficulties, unspecified: Secondary | ICD-10-CM | POA: Diagnosis present

## 2019-12-11 DIAGNOSIS — R1314 Dysphagia, pharyngoesophageal phase: Secondary | ICD-10-CM

## 2019-12-11 DIAGNOSIS — R1310 Dysphagia, unspecified: Secondary | ICD-10-CM

## 2019-12-11 NOTE — Therapy (Signed)
[place] [facility] DIAGNOSTIC RADIOLOGY [address] [place], [place], [address] Phone: [phone]   Fax:     Modified Barium Swallow  Patient Details  Name: STELLA BORTLE MRN: [id number] Date of Birth: 1970/09/23 No data recorded  Encounter Date: 12/11/2019   End of Session - 12/11/19 1638    Visit Number 1    Number of Visits 1    Date for SLP Re-Evaluation 12/11/19    SLP Start Time 1300    SLP Stop Time  1400    SLP Time Calculation (min) 60 min    Activity Tolerance Patient tolerated treatment well           Past Medical History:  Diagnosis Date  . Hypertension   . Neuropathy Obesity Osteoarthritis Depression Hypothyroid Neuropathy Tobacco use      Past Surgical History:  Procedure Laterality Date  . CESAREAN SECTION    . DILATION AND CURETTAGE OF UTERUS    . TONSILLECTOMY AND ADENOIDECTOMY      There were no vitals filed for this visit.       Subjective: Patient behavior: (alertness, ability to follow instructions, etc.): pt A/O x4; engaged appropriately w/ SLP and followed instructions. 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 She denied any h/o stroke but stated she was being followed by Neurology (05/2019: "Parkinsonism with bradykinesia and cognitive impairment - in a patient with long term use of Suboxone, concern for obstructive sleep apnea - SLUMS done at today's visit was 27/30."). Pt has ongoing Obesity, OA, Neuropathy. Recently has All Dentition removed and is awaiting fitting for Dentures when Gums fully heal. Noted pt walks w/ a cane this session. She denied any dx'd Reflux or GERD, however, endorsed s/s of REFLUX at night-time/early in morning. She also endorsed Nausea feelings and stated she could not eat "until the afternoon sometimes" b/c of it. Not on a PPI per her report.    Chief complaint: dysphagia OM Exam: appeared grossly WFL; min upper Gum-line irritation from recent Dentition removal this past  Summer(healing still ongoing per pt). Edentulous status.   Objective:  Radiological Procedure: A videoflouroscopic evaluation of oral-preparatory, reflex initiation, and pharyngeal phases of the swallow was performed; as well as a screening of the upper esophageal phase.  I. POSTURE: upright II. VIEW: lateral III. COMPENSATORY STRATEGIES: none indicated w/ trials IV. BOLUSES ADMINISTERED:  Thin Liquid: 7 trials  Nectar-thick Liquid: 1 trial  Honey-thick Liquid: NT  Puree: 4 trials  Mechanical Soft: 2 trials  Barium Tablet: Whole in tsp of Puree x1 V. RESULTS OF EVALUATION: A. ORAL PREPARATORY PHASE: (The lips, tongue, and velum are observed for strength and coordination)       **Overall Severity Rating: WFL.   B. SWALLOW INITIATION/REFLEX: (The reflex is normal if "triggered" by the time the bolus reached the base of the tongue)  **Overall Severity Rating: [facility].   C. PHARYNGEAL PHASE: (Pharyngeal function is normal if the bolus shows rapid, smooth, and continuous transit through the pharynx and there is no pharyngeal residue after the swallow)  **Overall Severity Rating: [facility].  D. LARYNGEAL PENETRATION: (Material entering into the laryngeal inlet/vestibule but not aspirated): NONE E. ASPIRATION: NONE F. ESOPHAGEAL PHASE: (Screening of the upper esophagus): noted min+ prominent cricopharyngeus muscle protrusion of tissue along the posterior Cervical Esophageal wall. This protrusion was consistently noted during the swallowing but did not impede bolus motility/clearing of the Cervical Esophagus w/ trials given at this session.    ASSESSMENT: Pt appears to  present w/ No oropharyngeal phase dysphagia; No neuromuscular deficits of swallowing. No aspiration or penetration of po trials was noted to occur during this study. There was noted to be a Min+, prominent thickening of cricopharyngeus muscle w/ protrusion of tissue from posterior Cervical Esophageal wall during the swallow -- this  protrusion was noted consistently but did not significantly impede bolus motility through the UES nor below in the viewable Cervical Esophagus. ANY Esophageal Dysmotility and/or slight retention of residue at/below the UES could impact Esophageal clearing thus impact the pharyngeal phase of swallowing -- Retrograde Backflow of material would be a concern. (Pt stated she has had to "cough her Pills back up" at times. Suspect this issue could be related to the pharyngeal and Esophageal sensations pt experiences when eating/drinking as her c/o difficulty is infrequent w/ no consistency found in swallowing any particular food or liquid. Shoulders obscured further viewing -- recommend f/u w/ GI/ENT for direct viewing of UES and below as well as the full Esophagus. During the oral phase, timely bolus management and control of bolus propulsion for A-P transfer occurred. Oral clearing achieved w/ all trial consistencies. During the pharyngeal phase, Timely pharyngeal swallow initiation noted w/ all trial consistencies. No aspiration or penetration of food/liquid occurred; airway closure appeared timely, tight. No pharyngeal residue remained post swallow indicating adequate laryngeal excursion and pharyngeal pressure during the swallow. Barium tablet Whole in Puree cleared the oropharynx and Esophagus(viewable) w/ ease and timeliness. Discussed results of MBSS and impact of Esophageal issues such as Reflux activity, dysmotility on swallowing. Discussed keeping a swallowing diary in order to identify situations in which swallowing was difficult for her(to include the situation, food, liquid, response). Video viewed and questions answered. Handouts given. Recommended f/u w/ GI for assessment and management of Reflux s/s as pt described Moderate s/s of Reflux; education. Recommended Oral/Gum Care daily and after any oral intake to promote healing.   PLAN/RECOMMENDATIONS:  A. Diet: Mech Soft diet w/ thin liquids; moistening  foods well, mashing. Rec. Soups, broken down foods. Pills in PUREE for ease of swallowing.  B. Swallowing Precautions: general aspiration precautions; REFLUX precautions  C. Recommended consultation to GI, ENT for formal assessments, management  D. Therapy recommendations: None   E. Results and recommendations were discussed w/ pt; pt agreed verbally         Pharyngoesophageal dysphagia  Dysphagia, unspecified type - Plan: DG SWALLOW FUNC SPEECH PATH, DG SWALLOW FUNC SPEECH PATH  Feeding difficulties - Plan: DG SWALLOW FUNC SPEECH PATH, DG SWALLOW FUNC SPEECH PATH        Problem List There are no problems to display for this patient.       Jerilynn Som, MS, CCC-SLP Speech Language Pathologist [facility] [phone] [facility] 12/11/2019, 4:39 PM  [facility] DIAGNOSTIC RADIOLOGY [address] [place], [place], [address] Phone: [phone]   Fax:     Name: SAAVI MCEACHRON MRN: [id number] Date of Birth: [birth year]

## 2019-12-25 ENCOUNTER — Ambulatory Visit: Payer: Medicaid Other

## 2020-01-08 ENCOUNTER — Ambulatory Visit: Admission: RE | Admit: 2020-01-08 | Payer: Medicaid Other | Source: Ambulatory Visit

## 2020-01-09 ENCOUNTER — Other Ambulatory Visit: Payer: Self-pay

## 2020-01-09 ENCOUNTER — Ambulatory Visit
Admission: RE | Admit: 2020-01-09 | Discharge: 2020-01-09 | Disposition: A | Discharge status: Home / Self Care | Payer: Medicaid Other | Source: Ambulatory Visit | Attending: Rheumatology | Admitting: Rheumatology

## 2020-01-09 DIAGNOSIS — M545 Low back pain, unspecified: Secondary | ICD-10-CM | POA: Diagnosis not present

## 2020-01-09 DIAGNOSIS — G8929 Other chronic pain: Secondary | ICD-10-CM | POA: Insufficient documentation

## 2020-01-09 IMAGING — MR MR LUMBAR SPINE W/O CM
5 series · 30 of 48 positions shown · non-contrast
Comparison: None.

CLINICAL DATA: Low back pain extending into the right lower
extremity with numbness. Left lower extremity pain is less severe.

EXAM:
MRI LUMBAR SPINE WITHOUT CONTRAST
TECHNIQUE: Multiplanar, multisequence MR imaging of the lumbar spine was
performed. No intravenous contrast was administered.

[Series 9: T2 · sagittal · 4.0mm · 0.81mm/px · 6 of 17 slices shown (1 of 2)]
[im 1/17]
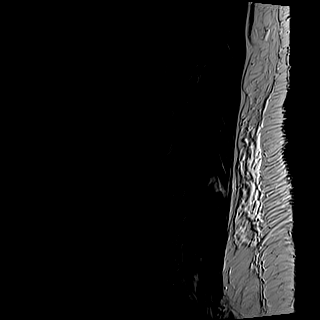
[im 4/17]
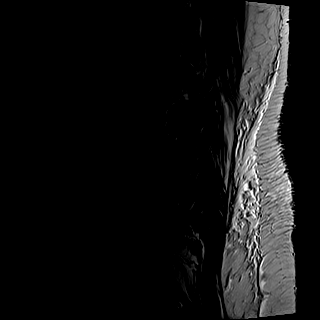
[im 7/17]
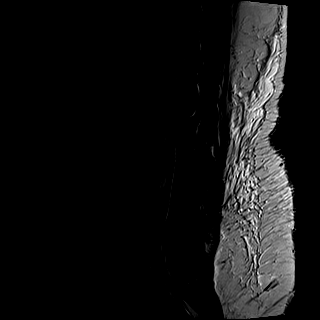
[im 10/17]
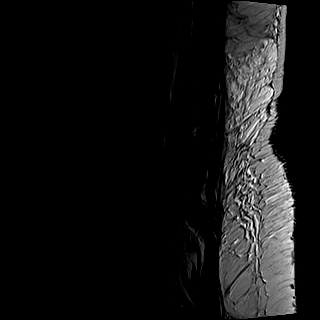
[im 13/17]
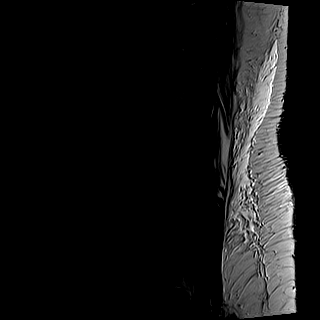
[im 17/17]
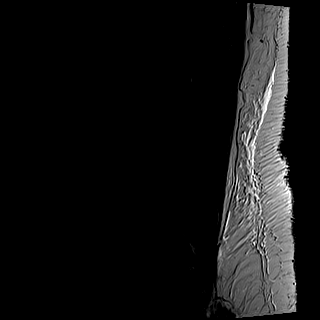

[Series 10: T1 · sagittal · 4.0mm · 0.81mm/px · 7 of 17 slices shown (1 of 2)]
[im 1/17]
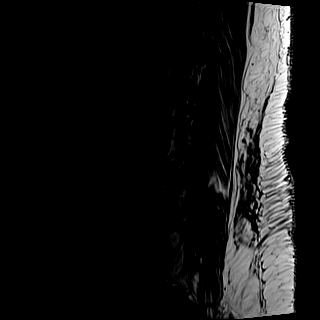
[im 3/17]
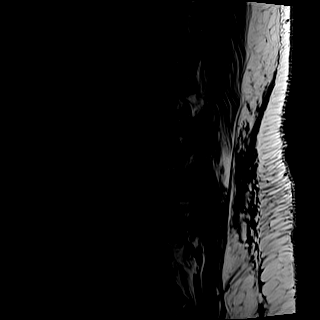
[im 6/17]
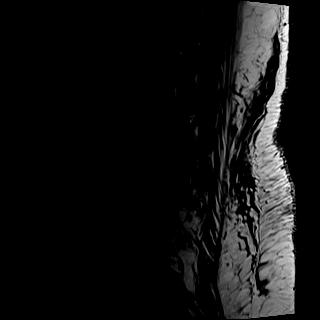
[im 9/17]
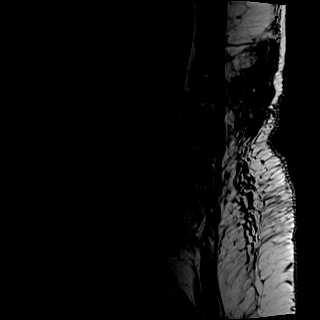
[im 11/17]
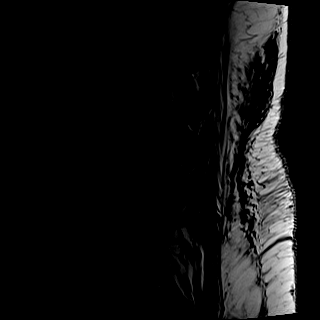
[im 14/17]
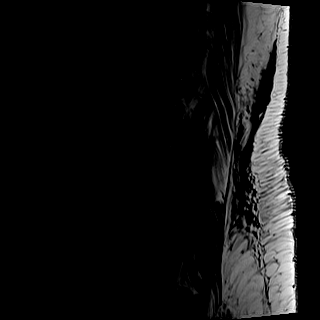
[im 17/17]
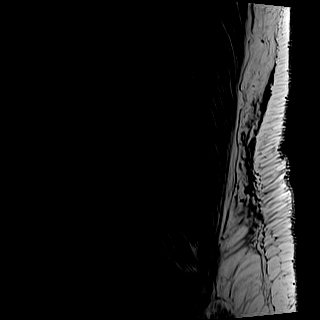

[Series 11: STIR · sagittal · 4.0mm · 0.41mm/px · 1 of 17 slices shown]
[im 1/17]
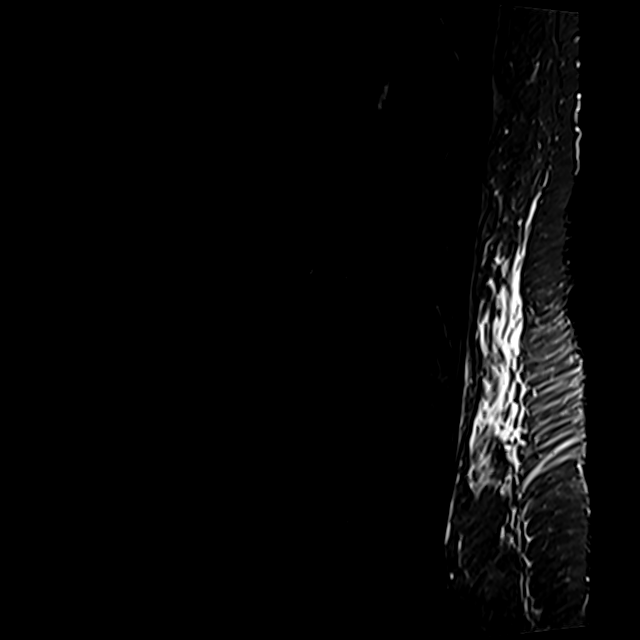

[Series 12: T2 · axial · 4.0mm · 0.78mm/px · z∈[-202,-10]mm · 8 of 34 slices shown (2 of 2)]
[im 1/34]
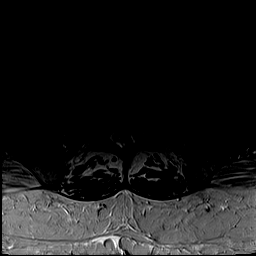
[im 6/34]
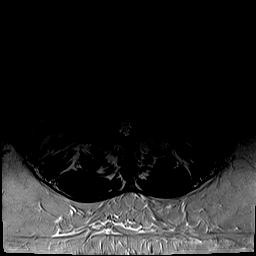
[im 11/34]
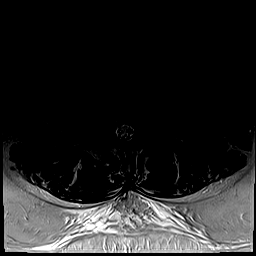
[im 16/34]
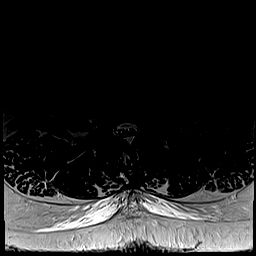
[im 18/34]
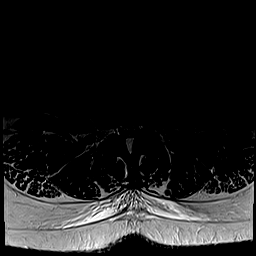
[im 23/34]
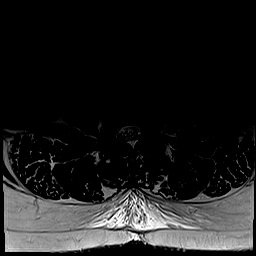
[im 28/34]
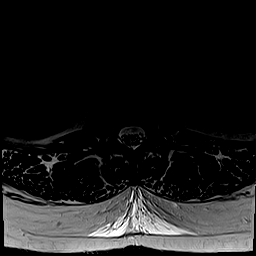
[im 34/34]
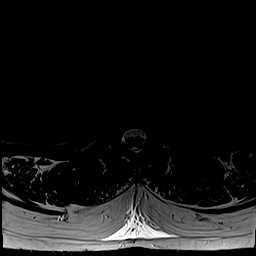

[Series 13: T1 · axial · 4.0mm · 0.39mm/px · z∈[-202,-10]mm · 8 of 34 slices shown (2 of 2)]
[im 1/34]
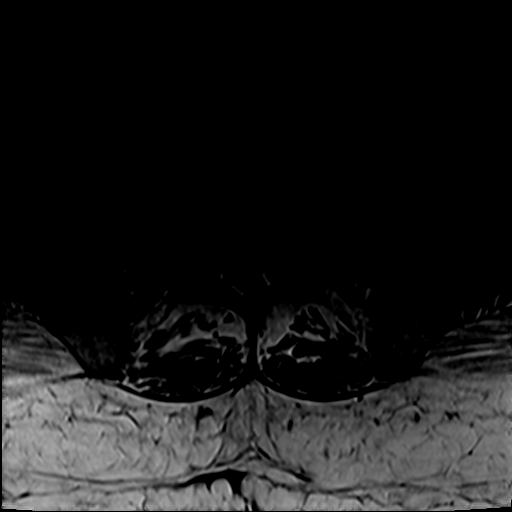
[im 6/34]
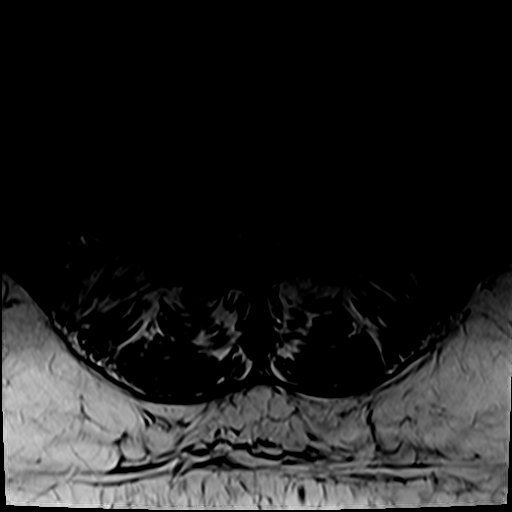
[im 11/34]
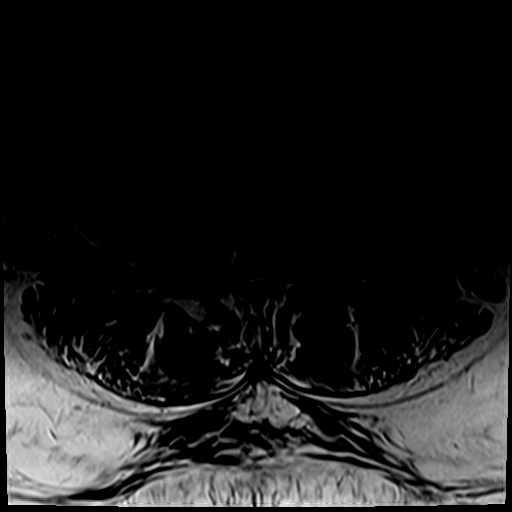
[im 16/34]
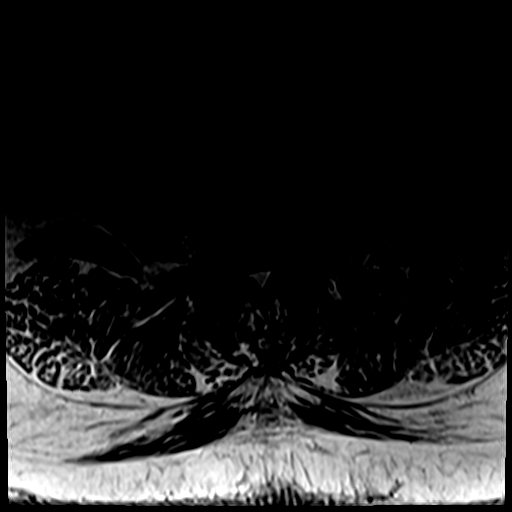
[im 18/34]
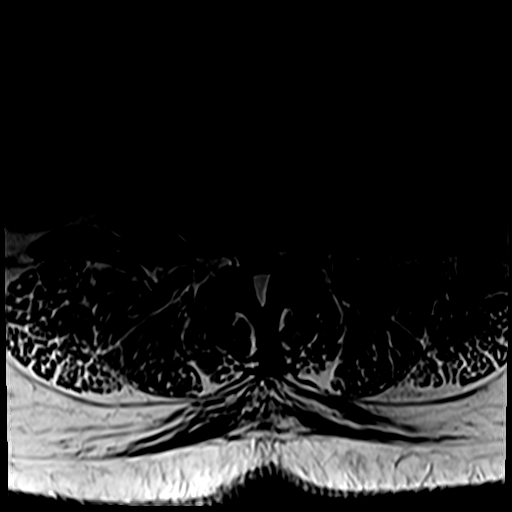
[im 23/34]
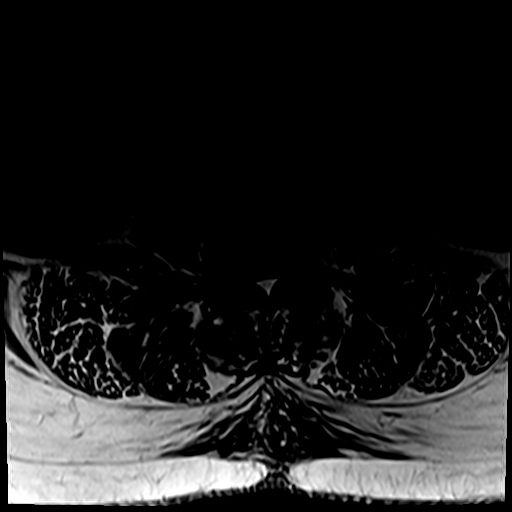
[im 28/34]
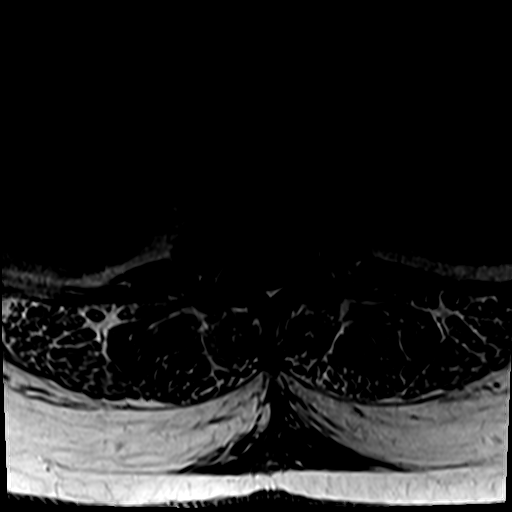
[im 34/34]
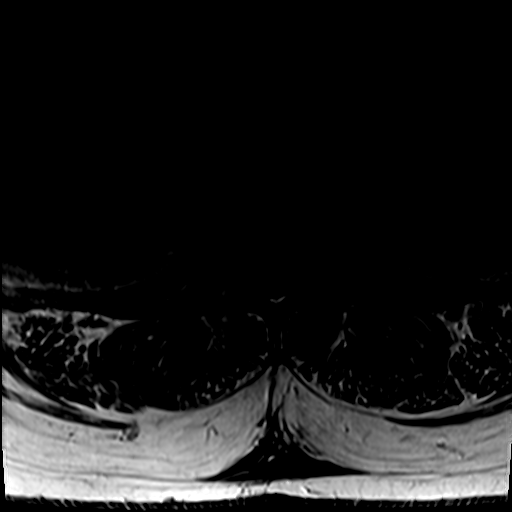

[30 of 48 positions shown; findings below may reference images not displayed]

FINDINGS: Segmentation: 5 non rib-bearing lumbar type vertebral bodies are
present. The lowest fully formed vertebral body is L5.

Alignment: Slight retrolisthesis present at L3-4 and L4-5.
Straightening of normal lumbar lordosis is noted.

Vertebrae: Hemangioma is noted anteriorly at L2. Mild chronic fatty
infiltration of the marrow is noted at the anterior endplates of
L4-5. Marrow signal and vertebral body heights are otherwise normal.

Conus medullaris and cauda equina: Conus extends to the L1 level.
Conus and cauda equina appear normal.

Paraspinal and other soft tissues: Central sinus cysts are present
in the scratched at central sinuses reverses hydronephrosis noted in
the right kidney. Ureter not in the field of view. Visualized
abdomen is otherwise unremarkable.

Disc levels:

T12-L1: Negative.

L1-2: Mild disc bulging and bilateral facet hypertrophy is noted. No
significant stenosis is present.

L2-3: Moderate facet hypertrophy is worse left than right. Mild disc
bulging is present without significant stenosis.

L3-4: Broad-based disc protrusion is present. Mild facet hypertrophy
is noted bilaterally. No significant stenosis is present.

L4-5: A leftward disc protrusion is present. The central canal is
patent. Disc extends into the foramina bilaterally. Mild bilateral
foraminal stenosis is present.

L5-S1: A rightward disc protrusion extends into the foramen.
Moderate right foraminal stenosis is present. Facet hypertrophy is
worse on the right.
IMPRESSION: 1. Moderate right foraminal stenosis at L5-S1 secondary to a
rightward disc protrusion and bilateral facet hypertrophy.
2. Mild bilateral foraminal narrowing at L4-5.
3. Moderate facet hypertrophy is worse on the left than right at
L2-3 without significant stenosis.

## 2020-03-15 ENCOUNTER — Other Ambulatory Visit: Payer: Medicaid Other

## 2020-03-18 ENCOUNTER — Other Ambulatory Visit: Payer: Medicaid Other

## 2020-05-24 ENCOUNTER — Encounter: Payer: Self-pay | Admitting: Emergency Medicine

## 2020-05-24 ENCOUNTER — Telehealth: Payer: Medicaid Other | Admitting: Emergency Medicine

## 2020-05-24 DIAGNOSIS — L03313 Cellulitis of chest wall: Secondary | ICD-10-CM

## 2020-05-24 DIAGNOSIS — M2550 Pain in unspecified joint: Secondary | ICD-10-CM

## 2020-05-24 DIAGNOSIS — L03314 Cellulitis of groin: Secondary | ICD-10-CM

## 2020-05-24 MED ORDER — SULFAMETHOXAZOLE-TRIMETHOPRIM 800-160 MG PO TABS: 1.0000 | ORAL_TABLET | Freq: Two times a day (BID) | ORAL | 0 refills | Status: AC

## 2020-05-24 MED ORDER — PREDNISONE 10 MG (21) PO TBPK: ORAL_TABLET | ORAL | 0 refills | Status: DC

## 2020-05-24 MED ORDER — MUPIROCIN 2 % EX OINT: TOPICAL_OINTMENT | Freq: Two times a day (BID) | CUTANEOUS | 0 refills | Status: AC

## 2020-05-24 NOTE — Progress Notes (Signed)
Whitney Martinez are scheduled for a virtual visit with your provider today.    Just as we do with appointments in the office, we must obtain your consent to participate.  Your consent will be active for this visit and any virtual visit you may have with one of our providers in the next 365 days.    If you have a MyChart account, I can also send a copy of this consent to you electronically.  All virtual visits are billed to your insurance company just like a traditional visit in the office.  As this is a virtual visit, video technology does not allow for your provider to perform a traditional examination.  This may limit your provider's ability to fully assess your condition.  If your provider identifies any concerns that need to be evaluated in person or the need to arrange testing such as labs, EKG, etc, we will make arrangements to do so.    Although advances in technology are sophisticated, we cannot ensure that it will always work on either your end or our end.  If the connection with a video visit is poor, we may have to switch to a telephone visit.  With either a video or telephone visit, we are not always able to ensure that we have a secure connection.   I need to obtain your verbal consent now.   Are you willing to proceed with your visit today?   Whitney Martinez has provided verbal consent on 05/24/2020 for a virtual visit (video or telephone).    Virtual Visit via Video   I connected with patient on 05/24/20 at  1:45 PM EDT by a video enabled telemedicine application and verified that I am speaking with the correct person using two identifiers.  Location patient: Home Location provider: [facility] Persons participating in the virtual visit: Patient, Provider  I discussed the limitations of evaluation and management by telemedicine and the availability of in person appointments. The patient expressed understanding and agreed to proceed.  Subjective:   HPI:   Patient  presents via Careg[facility] today reporting 3 days of worsening burning pustular rash under both breasts and in groin.  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  Symptoms worsened last night with a fever Tmax 101*F this morning, resolved with ibuprofen.  Associated body aches and joint pain. She was unable to get a same-day appointment with PCP today.  She is concerned she is developing cellulitis, which she has been hospitalized for several times in the past.  She is able to keep down fluids and manage fever with ibuprofen. The last flare she was prescribed doxycycline and does not feel that worked very well.  She has done well with bactrim and prednisone taper in the past. She plans to follow up later this week if not improving.   ROS:   See pertinent positives and negatives per HPI.  There are no problems to display for this patient.   Social History   Tobacco Use  . Smoking status: Former Smoker    Quit date: 10/2018    Years since quitting: 1.6  . Smokeless tobacco: Never Used  Substance Use Topics  . Alcohol use: Yes    Comment: rarely    Current Outpatient Medications:  .  mupirocin ointment (BACTROBAN) 2 %, Apply topically 2 (two) times daily for 5 days., Disp: 22 g, Rfl: 0 .  predniSONE (STERAPRED UNI-PAK 21 TAB) 10 MG (21) TBPK tablet, Day 1: take 6  tablets Day 2: take 5 tablets Day 3: take 4 tablets Day 4: take 3 tablets Day 5: take 2 tablets Day 6:  Take 1 tablet, Disp: 21 tablet, Rfl: 0 .  sulfamethoxazole-trimethoprim (BACTRIM DS) 800-160 MG tablet, Take 1 tablet by mouth 2 (two) times daily for 7 days., Disp: 14 tablet, Rfl: 0 .  albuterol (VENTOLIN HFA) 108 (90 Base) MCG/ACT inhaler, Inhale 2 puffs into the lungs every 6 (six) hours as needed for wheezing or shortness of breath., Disp: , Rfl:  .  aspirin EC 81 MG tablet, Take 81 mg by mouth daily., Disp: , Rfl:  .  Buprenorphine HCl-Naloxone HCl (SUBOXONE) 8-2 MG FILM, Place 2.5 Film under the tongue daily. , Disp: ,  Rfl:  .  EPINEPHrine (EPIPEN 2-PAK) 0.3 mg/0.3 mL IJ SOAJ injection, Inject 0.3 mLs (0.3 mg total) into the muscle as needed for anaphylaxis., Disp: 1 each, Rfl: 1 .  gabapentin (NEURONTIN) 600 MG tablet, Take 600 mg by mouth 3 (three) times daily. , Disp: , Rfl:  .  lisinopril-hydrochlorothiazide (ZESTORETIC) 20-25 MG tablet, Take 1 tablet by mouth daily., Disp: , Rfl:  .  PROVIGIL 200 MG tablet, Take 200 mg by mouth daily., Disp: , Rfl:   Allergies  Allergen Reactions  . Cefdinir Anaphylaxis, Hives and Swelling  . Diamox [Acetazolamide] Anaphylaxis    Objective:   There were no vitals taken for this visit.  Patient is well-developed, well-nourished in no acute distress.  Resting comfortably outside at home.  Head is normocephalic, atraumatic.  No labored breathing.  Speech is clear and coherent with logical content.  Patient is alert and oriented at baseline.  Skin under breasts and groin is erythematous, appears flat. No visible drainage.  Slightly tender to palpation by patient.   Assessment and Plan:   1. Cellulitis  - Bactrim DS 800-160mg  twice daily for 7 days  - Mupirocin ointment twice daily for 5 days 2. Arthralgia   Prednisone 10mg , 1 week taper   Discussed signs/symptoms that warrant in-person visit. Pt agreeable to schedule follow up later this week or next if not improving.   Greater than 5 minutes, yet less than 10 minutes of time have been spent researching, coordinating, and implementing care for this patient today.     , PA-C 05/24/2020  2:21 PM

## 2021-02-15 ENCOUNTER — Other Ambulatory Visit: Admission: RE | Admit: 2021-02-15 | Payer: Medicaid Other | Source: Ambulatory Visit

## 2021-03-08 ENCOUNTER — Ambulatory Visit: Payer: Medicaid Other

## 2021-03-31 ENCOUNTER — Other Ambulatory Visit: Payer: Self-pay | Admitting: Neurosurgery

## 2021-03-31 DIAGNOSIS — M5136 Other intervertebral disc degeneration, lumbar region: Secondary | ICD-10-CM

## 2021-03-31 DIAGNOSIS — M546 Pain in thoracic spine: Secondary | ICD-10-CM

## 2021-04-07 ENCOUNTER — Ambulatory Visit
Admission: RE | Admit: 2021-04-07 | Discharge: 2021-04-07 | Disposition: A | Discharge status: Home / Self Care | Payer: Medicaid Other | Source: Ambulatory Visit | Attending: Neurosurgery | Admitting: Neurosurgery

## 2021-04-07 DIAGNOSIS — M5136 Other intervertebral disc degeneration, lumbar region: Secondary | ICD-10-CM | POA: Insufficient documentation

## 2021-04-07 DIAGNOSIS — M546 Pain in thoracic spine: Secondary | ICD-10-CM | POA: Diagnosis present

## 2021-04-07 IMAGING — MR MR THORACIC SPINE W/O CM
5 of 6 series · 27 of 48 positions shown · non-contrast
Comparison: Lumbar spine MRI [DATE]

CLINICAL DATA: Mid and upper back pain worse when walking or
standing, burning pain. Low back pain radiating to both buttocks and
groins down back of thighs. Symptoms for more than 6 months

EXAM:
MRI THORACIC AND LUMBAR SPINE WITHOUT CONTRAST
TECHNIQUE: Multiplanar and multiecho pulse sequences of the thoracic and lumbar
spine were obtained without intravenous contrast.

[Series 16: T1 · sagittal · 5.0mm · 1.41mm/px · 2 of 9 slices shown (1 of 2)]
[im 1/9]
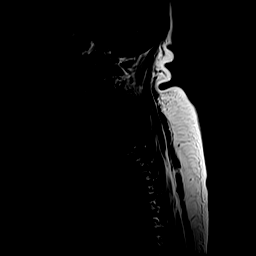
[im 9/9]
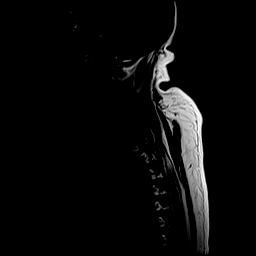

[Series 17: T2 · sagittal · 3.0mm · 1.06mm/px · 6 of 17 slices shown (1 of 2)]
[im 1/17]
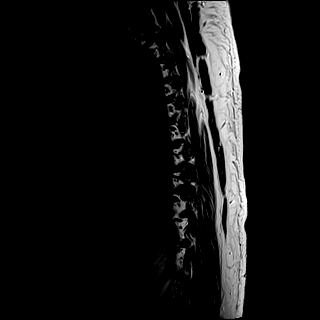
[im 4/17]
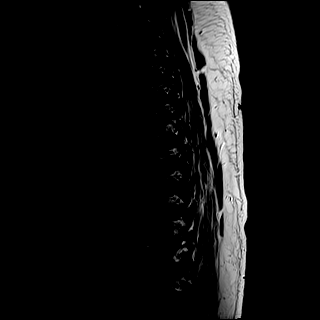
[im 7/17]
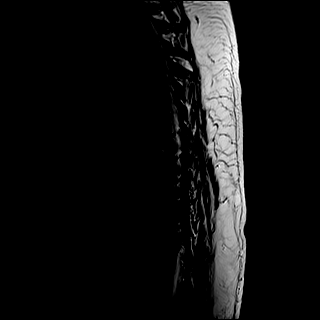
[im 10/17]
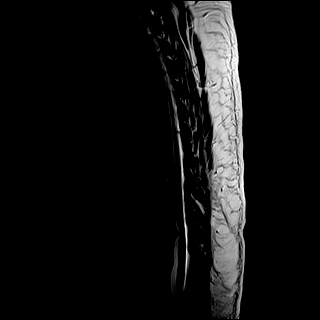
[im 13/17]
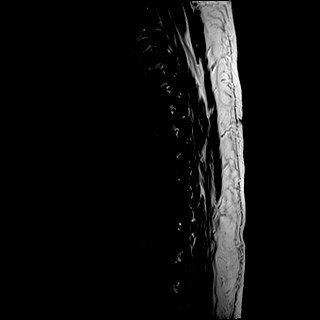
[im 17/17]
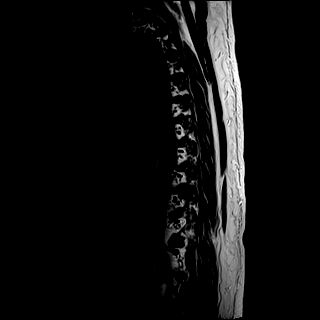

[Series 18: T1 · sagittal · 3.0mm · 1.06mm/px · 6 of 17 slices shown (2 of 2)]
[im 1/17]
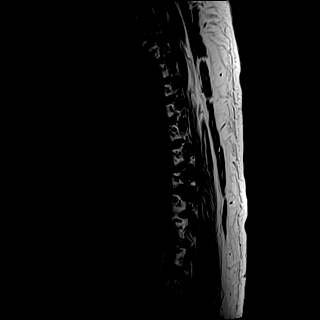
[im 4/17]
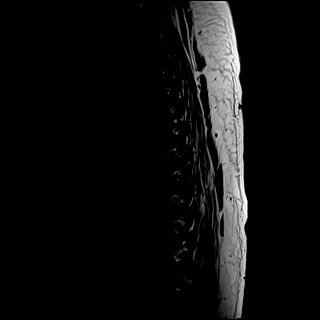
[im 7/17]
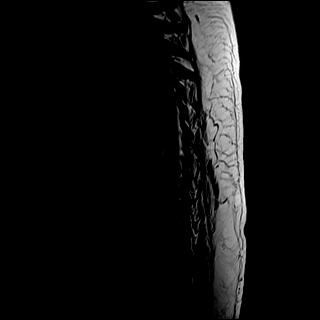
[im 10/17]
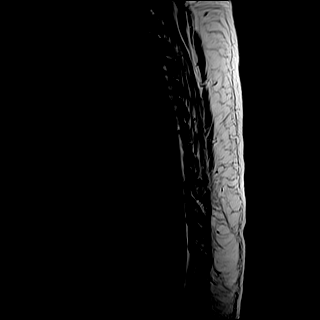
[im 13/17]
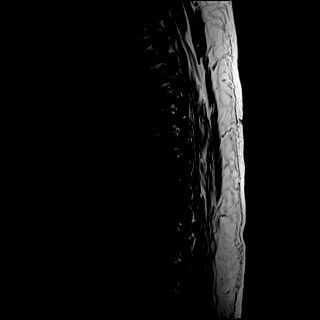
[im 17/17]
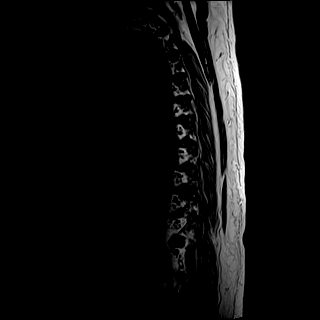

[Series 19: STIR · sagittal · 3.0mm · 0.53mm/px · 5 of 17 slices shown]
[im 1/17]
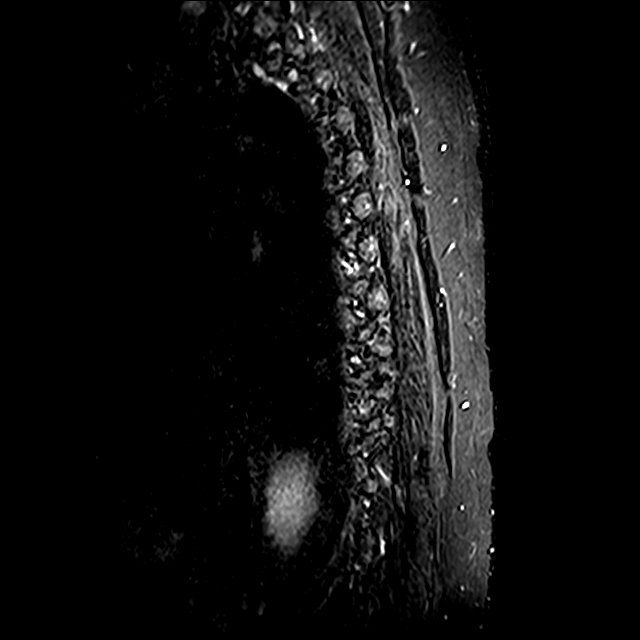
[im 4/17]
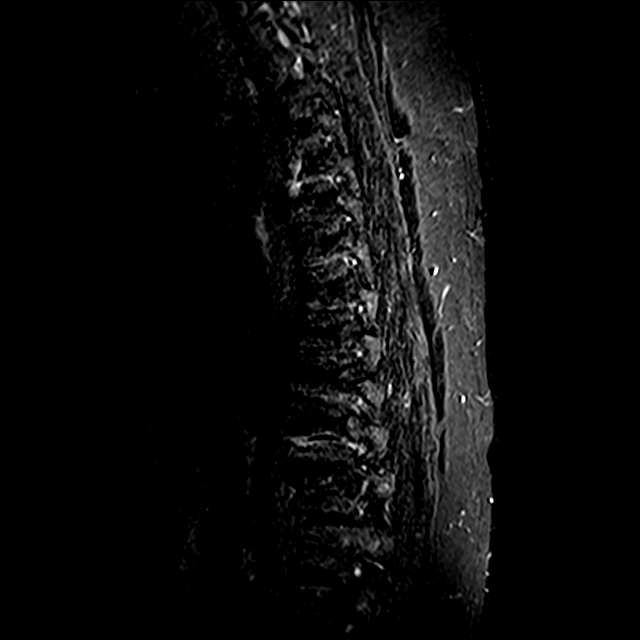
[im 7/17]
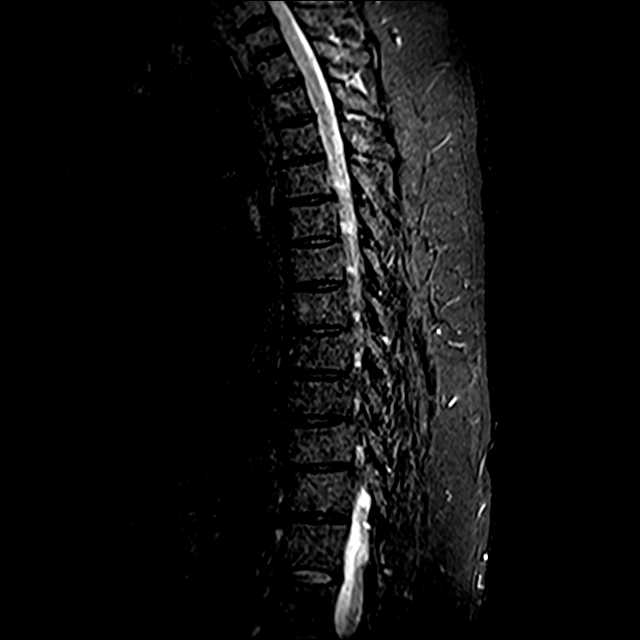
[im 10/17]
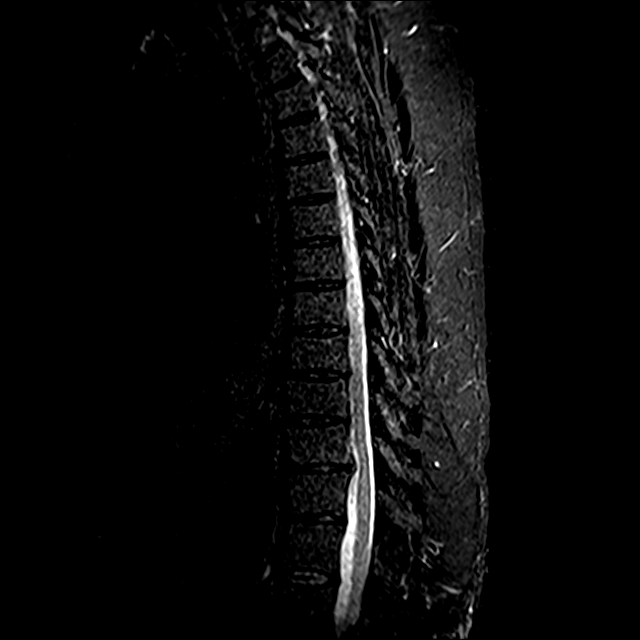
[im 13/17]
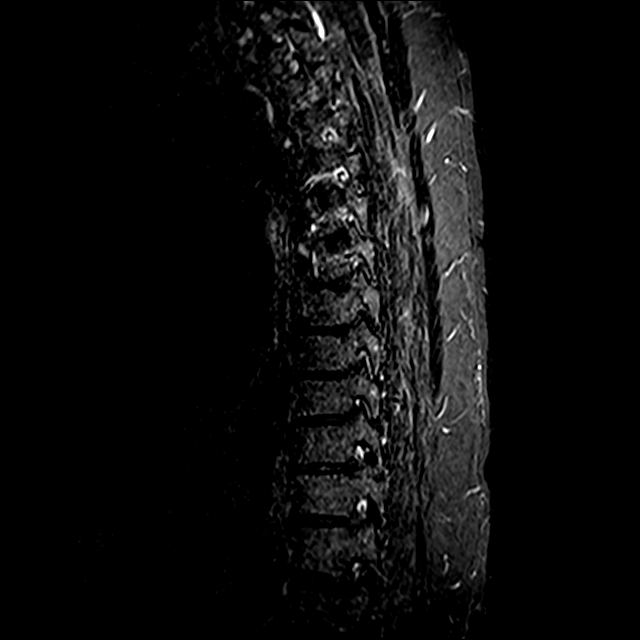

[Series 20: T2 · axial · 4.0mm · 0.59mm/px · z∈[-330,-85]mm · 8 of 39 slices shown (2 of 2)]
[im 1/39]
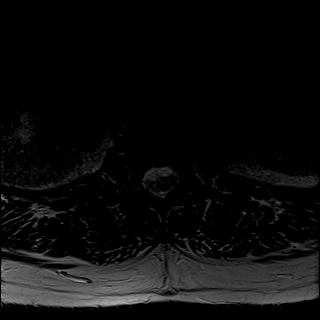
[im 6/39]
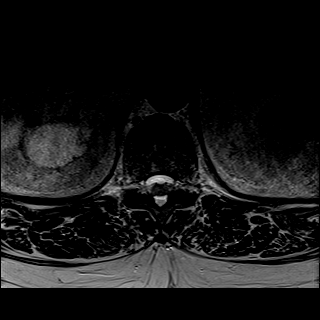
[im 12/39]
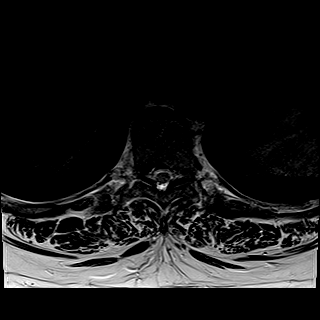
[im 18/39]
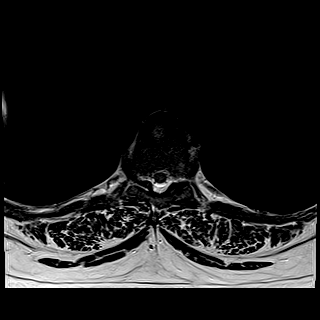
[im 21/39]
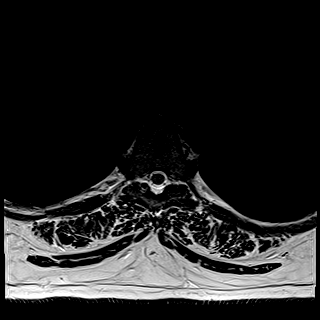
[im 27/39]
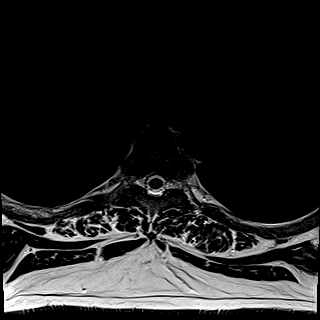
[im 33/39]
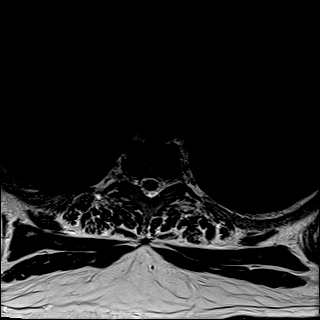
[im 39/39]
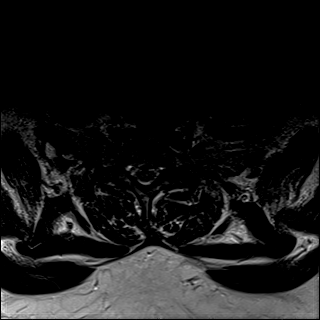

[27 of 48 positions shown; findings below may reference images not displayed]

FINDINGS: MRI THORACIC SPINE FINDINGS

Alignment:  Normal.

Vertebrae: Vertebral body heights are preserved, without evidence of
acute injury. Foci of T1 and T2 hyperintensity in the T7 and T8
vertebral bodies likely reflect intraosseous hemangiomas. There is
no suspicious marrow signal abnormality. There is no marrow edema.

Cord:  Normal in signal and morphology.

Paraspinal and other soft tissues: Unremarkable.

Disc levels:

The intervertebral disc spaces are overall preserved. There are
small protrusions at T4-T5, T7-T8, T8-T9, T9-T10, and T10-T11, most
pronounced at T10-T11 where there is mild spinal canal stenosis and
slight indentation of the ventral cord. There is no significant
spinal canal stenosis at the other levels. There is mild facet
arthropathy in the thoracic spine resulting in up to moderate
bilateral neural foraminal stenosis at T9-T10. There is no other
significant neural foraminal stenosis in the thoracic spine.

MRI LUMBAR SPINE FINDINGS

Segmentation:  Standard.

Alignment: Is straightening of the normal lumbar lordosis. There is
grade 1 retrolisthesis of L4 on L5, unchanged. There is mild
dextrocurvature centered at L3, unchanged. Alignment is otherwise
normal.

Vertebrae: Vertebral body heights are preserved. A focus of T1 and
T2 hyperintensity in the L2 vertebral body likely reflects an
intraosseous hemangioma. There is mild degenerative endplate marrow
signal abnormality anteriorly at L4-L5. There is no suspicious
marrow signal abnormality. There is no marrow edema.

Conus medullaris and cauda equina: Conus extends to the T12-L1
level. Conus and cauda equina appear normal.

Paraspinal and other soft tissues: Probable parapelvic renal cysts
are noted on the right. The paraspinal soft tissues are
unremarkable.

Disc levels:

There is mild disc desiccation without significant loss of height in
the lumbar spine. There is multilevel facet arthropathy, most
advanced at L4-L5. There is a trace effusion and trace perifacetal
soft tissue edema on the right at L2-L3.

T12-L1: There is a small central protrusion without significant
spinal canal or neural foraminal stenosis, not significantly
changed.

L1-L2: There is a diffuse disc bulge and bilateral facet arthropathy
resulting in mild spinal canal stenosis without significant neural
foraminal stenosis, not significantly changed.

L2-L3: Bilateral facet arthropathy without significant spinal canal
or neural foraminal stenosis.

L3-L4: There is a mild disc bulge and left worse than right facet
arthropathy without significant spinal canal or neural foraminal
stenosis, not significantly changed.

L4-L5: There is a mild disc bulge extending into the bilateral
foramina and moderate bilateral facet arthropathy resulting in mild
narrowing of the left subarticular zone without evidence of nerve
root impingement and mild to moderate right and mild left neural
foraminal stenosis. Findings are overall not significantly changed.

L5-S1: There is a prominent right foraminal/extraforaminal
protrusion and bilateral facet arthropathy resulting in severe right
neural foraminal stenosis with impingement of the exiting L5 nerve
root. There is no significant spinal canal or left neural foraminal
stenosis. Findings are not significantly changed.
IMPRESSION: MR THORACIC SPINE IMPRESSION

1. Mild multilevel degenerative changes resulting in up to moderate
bilateral neural foraminal stenosis at T9-T10 and mild spinal canal
stenosis with slight indentation of the ventral cord at T10-T11.
2. No evidence of acute injury in the thoracic spine.

MR LUMBAR SPINE IMPRESSION

1. Overall, findings in the lumbar spine are not significantly
changed since the study from [DATE].
2. Prominent right foraminal/extraforaminal protrusion at L5-S1
resulting in severe right neural foraminal stenosis with impingement
of the exiting L5 nerve root, not significantly changed.
3. Mild spinal canal stenosis at L1-L2.
4. Narrowing of the left subarticular zone without evidence of nerve
root impingement, and mild-to-moderate right and mild left neural
foraminal stenosis at L4-L5.
5. Multilevel facet arthropathy, most advanced at L4-L5. There is a
trace effusion and trace perifacetal soft tissue edema on the right
at L2-L3.
6. Mild dextrocurvature centered at L3.

## 2021-04-07 IMAGING — MR MR LUMBAR SPINE W/O CM
4 of 5 series · 29 of 48 positions shown · non-contrast
Comparison: Lumbar spine MRI [DATE]

CLINICAL DATA: Mid and upper back pain worse when walking or
standing, burning pain. Low back pain radiating to both buttocks and
groins down back of thighs. Symptoms for more than 6 months

EXAM:
MRI THORACIC AND LUMBAR SPINE WITHOUT CONTRAST
TECHNIQUE: Multiplanar and multiecho pulse sequences of the thoracic and lumbar
spine were obtained without intravenous contrast.

[Series 9: T2 · sagittal · 4.0mm · 0.81mm/px · 6 of 17 slices shown (1 of 2)]
[im 1/17]
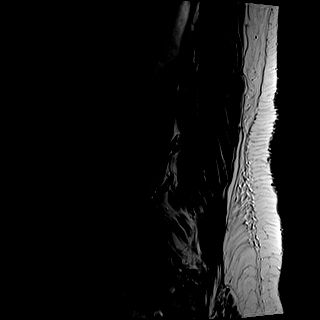
[im 4/17]
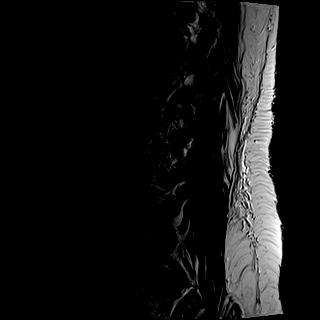
[im 7/17]
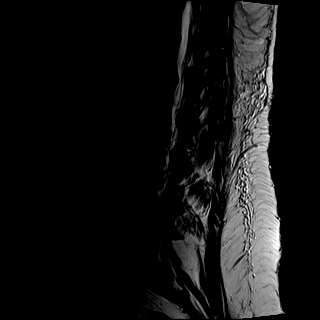
[im 10/17]
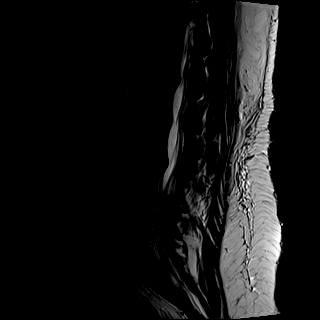
[im 13/17]
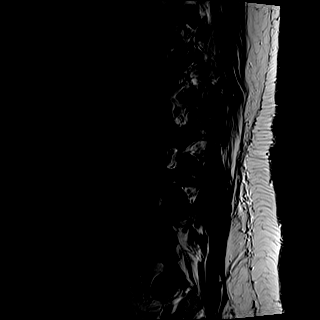
[im 17/17]
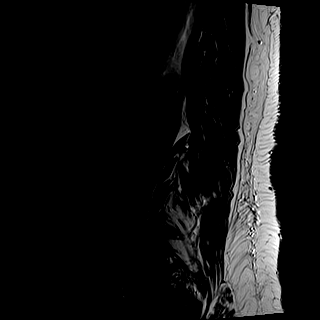

[Series 10: T1 · sagittal · 4.0mm · 0.81mm/px · 7 of 17 slices shown (1 of 2)]
[im 1/17]
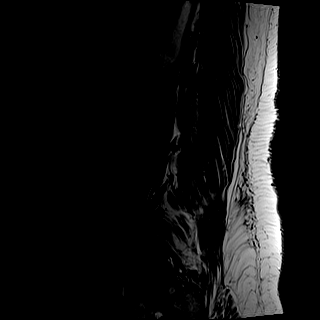
[im 3/17]
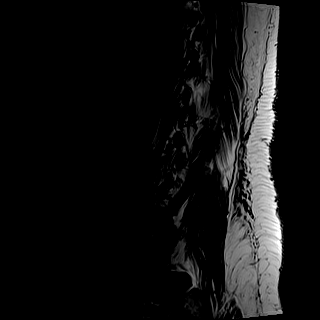
[im 6/17]
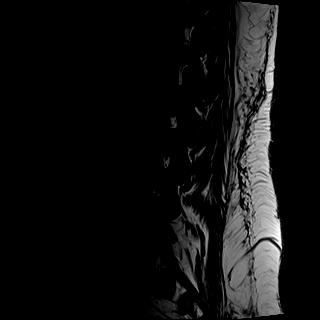
[im 9/17]
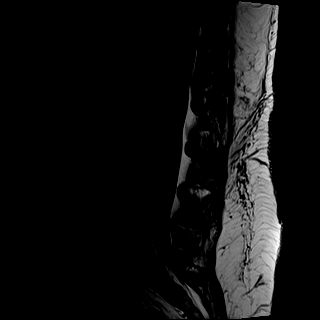
[im 11/17]
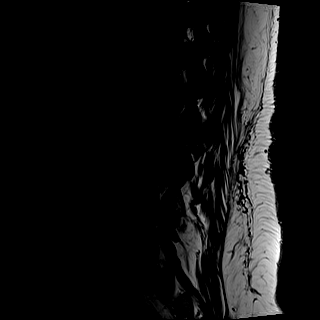
[im 14/17]
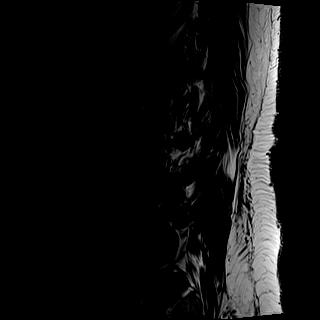
[im 17/17]
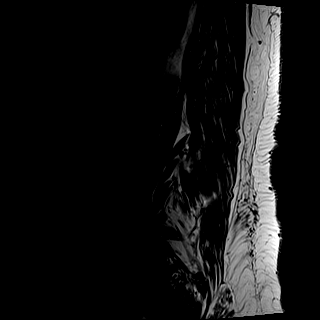

[Series 12: T2 · axial · 4.0mm · 0.78mm/px · z∈[-247,-36]mm · 8 of 37 slices shown (2 of 2)]
[im 1/37]
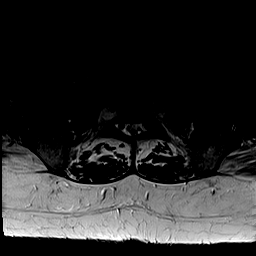
[im 6/37]
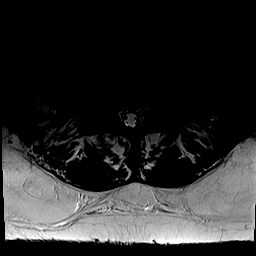
[im 12/37]
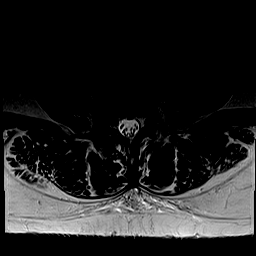
[im 17/37]
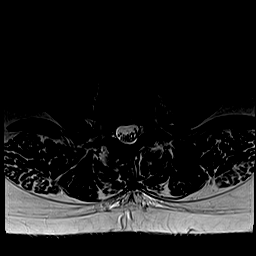
[im 20/37]
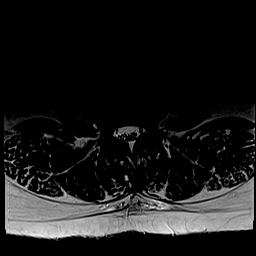
[im 25/37]
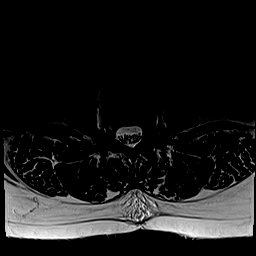
[im 31/37]
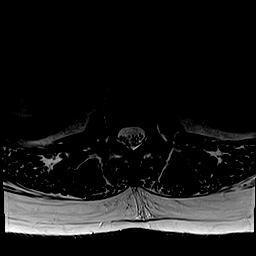
[im 37/37]
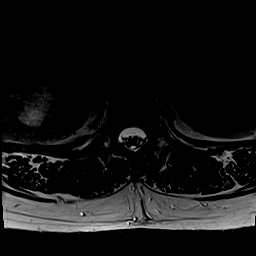

[Series 13: T1 · axial · 4.0mm · 0.39mm/px · z∈[-247,-36]mm · 8 of 37 slices shown (2 of 2)]
[im 1/37]
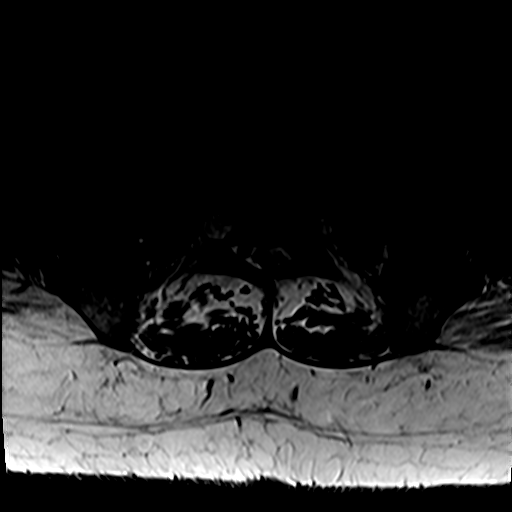
[im 6/37]
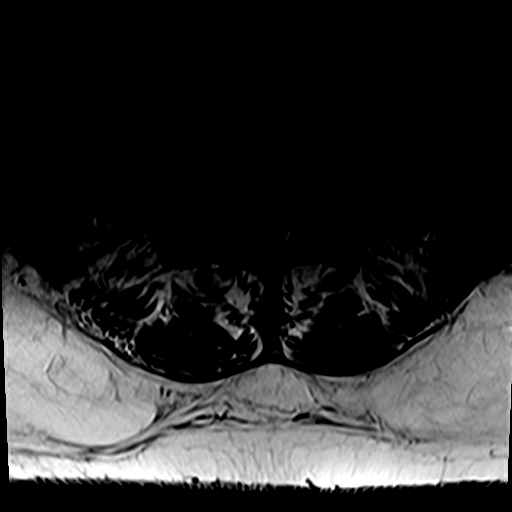
[im 12/37]
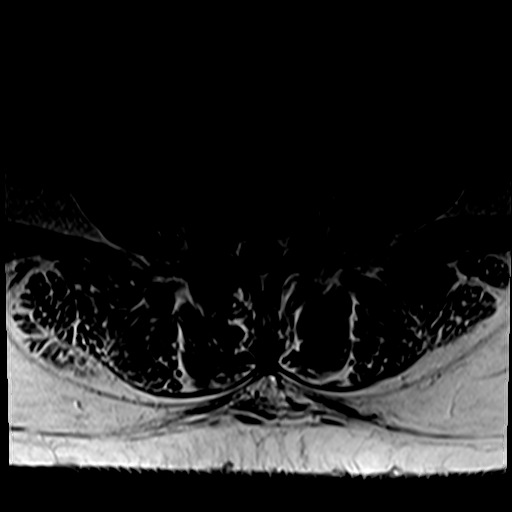
[im 17/37]
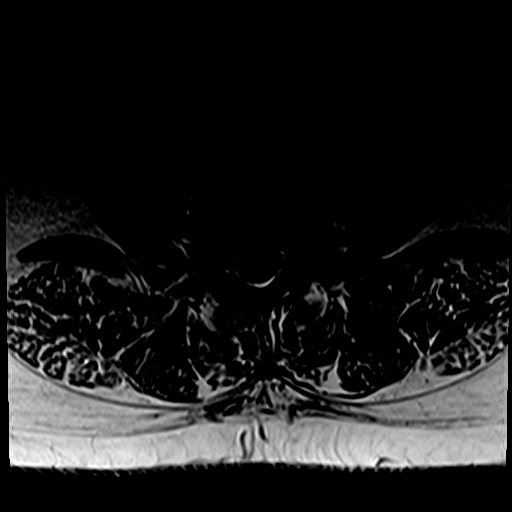
[im 20/37]
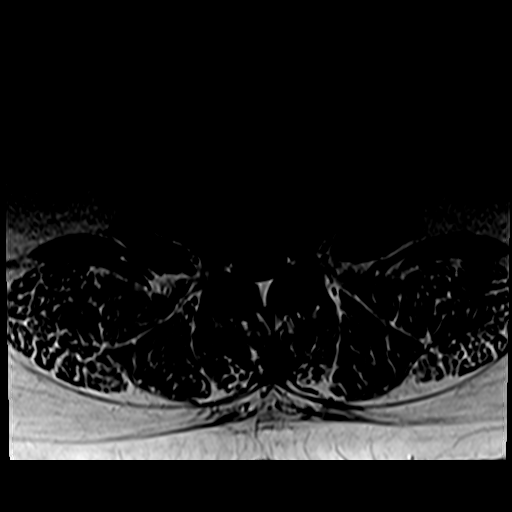
[im 25/37]
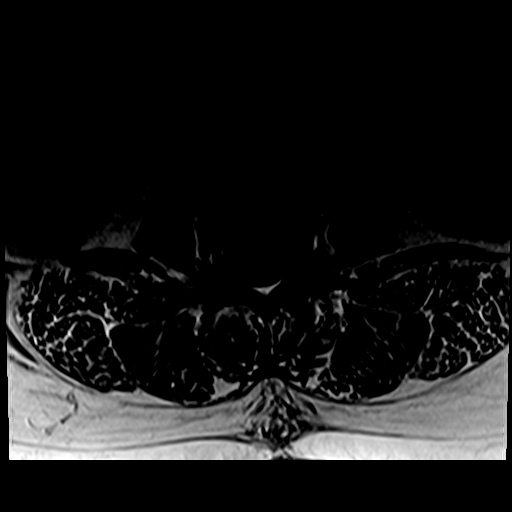
[im 31/37]
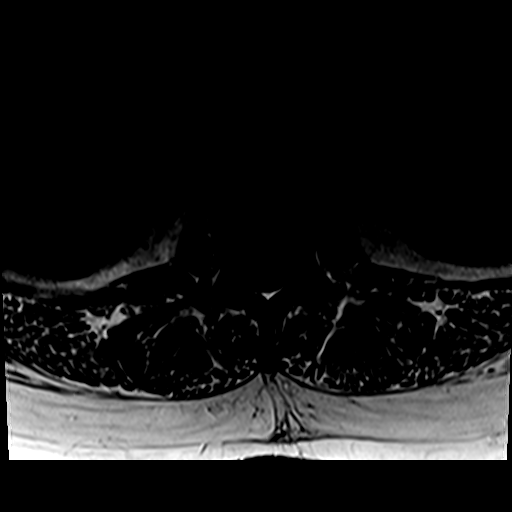
[im 37/37]
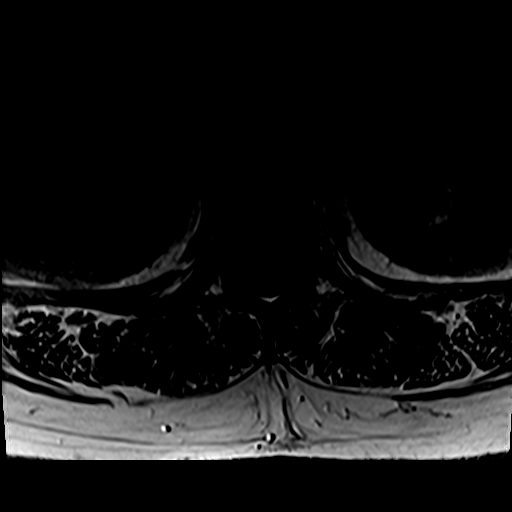

[29 of 48 positions shown; findings below may reference images not displayed]

FINDINGS: MRI THORACIC SPINE FINDINGS

Alignment:  Normal.

Vertebrae: Vertebral body heights are preserved, without evidence of
acute injury. Foci of T1 and T2 hyperintensity in the T7 and T8
vertebral bodies likely reflect intraosseous hemangiomas. There is
no suspicious marrow signal abnormality. There is no marrow edema.

Cord:  Normal in signal and morphology.

Paraspinal and other soft tissues: Unremarkable.

Disc levels:

The intervertebral disc spaces are overall preserved. There are
small protrusions at T4-T5, T7-T8, T8-T9, T9-T10, and T10-T11, most
pronounced at T10-T11 where there is mild spinal canal stenosis and
slight indentation of the ventral cord. There is no significant
spinal canal stenosis at the other levels. There is mild facet
arthropathy in the thoracic spine resulting in up to moderate
bilateral neural foraminal stenosis at T9-T10. There is no other
significant neural foraminal stenosis in the thoracic spine.

MRI LUMBAR SPINE FINDINGS

Segmentation:  Standard.

Alignment: Is straightening of the normal lumbar lordosis. There is
grade 1 retrolisthesis of L4 on L5, unchanged. There is mild
dextrocurvature centered at L3, unchanged. Alignment is otherwise
normal.

Vertebrae: Vertebral body heights are preserved. A focus of T1 and
T2 hyperintensity in the L2 vertebral body likely reflects an
intraosseous hemangioma. There is mild degenerative endplate marrow
signal abnormality anteriorly at L4-L5. There is no suspicious
marrow signal abnormality. There is no marrow edema.

Conus medullaris and cauda equina: Conus extends to the T12-L1
level. Conus and cauda equina appear normal.

Paraspinal and other soft tissues: Probable parapelvic renal cysts
are noted on the right. The paraspinal soft tissues are
unremarkable.

Disc levels:

There is mild disc desiccation without significant loss of height in
the lumbar spine. There is multilevel facet arthropathy, most
advanced at L4-L5. There is a trace effusion and trace perifacetal
soft tissue edema on the right at L2-L3.

T12-L1: There is a small central protrusion without significant
spinal canal or neural foraminal stenosis, not significantly
changed.

L1-L2: There is a diffuse disc bulge and bilateral facet arthropathy
resulting in mild spinal canal stenosis without significant neural
foraminal stenosis, not significantly changed.

L2-L3: Bilateral facet arthropathy without significant spinal canal
or neural foraminal stenosis.

L3-L4: There is a mild disc bulge and left worse than right facet
arthropathy without significant spinal canal or neural foraminal
stenosis, not significantly changed.

L4-L5: There is a mild disc bulge extending into the bilateral
foramina and moderate bilateral facet arthropathy resulting in mild
narrowing of the left subarticular zone without evidence of nerve
root impingement and mild to moderate right and mild left neural
foraminal stenosis. Findings are overall not significantly changed.

L5-S1: There is a prominent right foraminal/extraforaminal
protrusion and bilateral facet arthropathy resulting in severe right
neural foraminal stenosis with impingement of the exiting L5 nerve
root. There is no significant spinal canal or left neural foraminal
stenosis. Findings are not significantly changed.
IMPRESSION: MR THORACIC SPINE IMPRESSION

1. Mild multilevel degenerative changes resulting in up to moderate
bilateral neural foraminal stenosis at T9-T10 and mild spinal canal
stenosis with slight indentation of the ventral cord at T10-T11.
2. No evidence of acute injury in the thoracic spine.

MR LUMBAR SPINE IMPRESSION

1. Overall, findings in the lumbar spine are not significantly
changed since the study from [DATE].
2. Prominent right foraminal/extraforaminal protrusion at L5-S1
resulting in severe right neural foraminal stenosis with impingement
of the exiting L5 nerve root, not significantly changed.
3. Mild spinal canal stenosis at L1-L2.
4. Narrowing of the left subarticular zone without evidence of nerve
root impingement, and mild-to-moderate right and mild left neural
foraminal stenosis at L4-L5.
5. Multilevel facet arthropathy, most advanced at L4-L5. There is a
trace effusion and trace perifacetal soft tissue edema on the right
at L2-L3.
6. Mild dextrocurvature centered at L3.

## 2021-04-25 ENCOUNTER — Ambulatory Visit: Payer: Medicaid Other | Admitting: Urology

## 2021-05-05 ENCOUNTER — Encounter: Payer: Self-pay | Admitting: Urology

## 2021-05-05 ENCOUNTER — Ambulatory Visit: Payer: Medicaid Other | Admitting: Urology

## 2021-05-05 ENCOUNTER — Other Ambulatory Visit: Payer: Self-pay

## 2021-05-05 VITALS — BP 148/74 | HR 86 | Ht 69.0 in | Wt 307.0 lb

## 2021-05-05 DIAGNOSIS — R3915 Urgency of urination: Secondary | ICD-10-CM

## 2021-05-05 DIAGNOSIS — R350 Frequency of micturition: Secondary | ICD-10-CM

## 2021-05-05 DIAGNOSIS — N3941 Urge incontinence: Secondary | ICD-10-CM | POA: Diagnosis not present

## 2021-05-05 MED ORDER — MIRABEGRON ER 50 MG PO TB24: 50.0000 mg | ORAL_TABLET | Freq: Every day | ORAL | 0 refills | Status: AC

## 2021-05-05 NOTE — Progress Notes (Signed)
? ?05/05/2021 ?9:07 AM  ? ?Whitney Martinez ?1970-05-04 ?[id number] ? ?Referring provider: Loleta Dicker, PA ?[place],  [place] [address] ? ?Chief Complaint  ?Patient presents with  ? Urinary Incontinence  ? ? ?HPI: ?Whitney Martinez is a 51 y.o. female referred for evaluation of urinary incontinence. ? ?2 year history of urinary incontinence which has worsened over the last several months ?Symptoms include urinary frequency, urgency with urge incontinence; weak urinary stream on occasions ?No dysuria, gross hematuria or recurrent UTI ?Does have spinal stenosis and degenerative disc disease currently being followed by neurosurgery ?Past urologic history markable for stone disease with prior stent placement and lithotripsy.  No recent problems ?Spinal MRI failed to show right parapelvic cysts ? ?PMH: ?Past Medical History:  ?Diagnosis Date  ? Hypertension   ? Neuropathy   ? ? ?Surgical History: ?Past Surgical History:  ?Procedure Laterality Date  ? CESAREAN SECTION    ? DILATION AND CURETTAGE OF UTERUS    ? TONSILLECTOMY AND ADENOIDECTOMY    ? ? ?Home Medications:  ?Allergies as of 05/05/2021   ? ?   Reactions  ? Cefdinir Anaphylaxis, Hives, Swelling  ? Diamox [acetazolamide] Anaphylaxis  ? ?  ? ?  ?Medication List  ?  ? ?  ? Accurate as of May 05, 2021 11:59 PM. If you have any questions, ask your nurse or doctor.  ?  ?  ? ?  ? ?STOP taking these medications   ? ?albuterol 108 (90 Base) MCG/ACT inhaler ?Commonly known as: VENTOLIN HFA ?Stopped by: Abbie Sons, MD ?  ? ?  ? ?TAKE these medications   ? ?aspirin EC 81 MG tablet ?Take 81 mg by mouth daily. ?  ?Buprenorphine HCl-Naloxone HCl 8-2 MG Film ?Place 2.5 Film under the tongue daily. ?  ?EPINEPHrine 0.3 mg/0.3 mL Soaj injection ?Commonly known as: EpiPen 2-Pak ?Inject 0.3 mLs (0.3 mg total) into the muscle as needed for anaphylaxis. ?  ?gabapentin 600 MG tablet ?Commonly known as: NEURONTIN ?Take 600 mg by mouth 3 (three) times daily. ?   ?lisinopril-hydrochlorothiazide 20-25 MG tablet ?Commonly known as: ZESTORETIC ?Take 1 tablet by mouth daily. ?  ?mirabegron ER 50 MG Tb24 tablet ?Commonly known as: MYRBETRIQ ?Take 1 tablet (50 mg total) by mouth daily. ?Started by: Abbie Sons, MD ?  ?predniSONE 10 MG (21) Tbpk tablet ?Commonly known as: STERAPRED UNI-PAK 21 TAB ?Day 1: take 6 tablets Day 2: take 5 tablets Day 3: take 4 tablets Day 4: take 3 tablets Day 5: take 2 tablets Day 6:  Take 1 tablet ?  ?Provigil 200 MG tablet ?Generic drug: modafinil ?Take 200 mg by mouth daily. ?  ? ?  ? ? ?Allergies:  ?Allergies  ?Allergen Reactions  ? Cefdinir Anaphylaxis, Hives and Swelling  ? Diamox [Acetazolamide] Anaphylaxis  ? ? ?Family History: ?Family History  ?Problem Relation Age of Onset  ? Hypertension Mother   ? Kidney cancer Mother   ? Diabetes Father   ? Hypertension Father   ? Depression Father   ? Bipolar disorder Father   ? Breast cancer Neg Hx   ? ? ?Social History:  reports that she quit smoking about 2 years ago. Her smoking use included cigarettes. She has never used smokeless tobacco. She reports current alcohol use. She reports that she does not use drugs. ? ? ?Physical Exam: ?BP (!) 148/74   Pulse 86   Ht 5\' 9"  (1.753 m)   Wt (!) 307  lb (139.3 kg)   BMI 45.34 kg/m?   ?Constitutional:  Alert and oriented, No acute distress. ?HEENT: [place] AT, moist mucus membranes.  Trachea midline, no masses. ?Cardiovascular: No clubbing, cyanosis, or edema. ?Respiratory: Normal respiratory effort, no increased work of breathing. ?Psychiatric: Normal mood and affect. ? ? ?Assessment & Plan:   ?51 y.o. female with spinal stenosis/degenerative disc disease with urinary frequency, urgency and urge incontinence.  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 ? ?Abbie Sons, MD ? ?[facility] ?[address] ?[place],  [address] ?[phone] ? ?

## 2021-05-06 ENCOUNTER — Encounter: Payer: Self-pay | Admitting: Urology

## 2021-06-06 ENCOUNTER — Encounter: Payer: Self-pay | Admitting: Urology

## 2021-06-06 ENCOUNTER — Ambulatory Visit: Payer: Medicaid Other | Admitting: Urology

## 2021-07-29 ENCOUNTER — Encounter: Payer: Self-pay | Admitting: Urology

## 2021-08-16 ENCOUNTER — Ambulatory Visit (INDEPENDENT_AMBULATORY_CARE_PROVIDER_SITE_OTHER): Payer: Medicaid Other

## 2021-08-16 ENCOUNTER — Ambulatory Visit
Admission: EM | Admit: 2021-08-16 | Discharge: 2021-08-16 | Disposition: A | Discharge status: Home / Self Care | Payer: Medicaid Other | Attending: Emergency Medicine | Admitting: Emergency Medicine

## 2021-08-16 ENCOUNTER — Other Ambulatory Visit: Payer: Self-pay

## 2021-08-16 ENCOUNTER — Ambulatory Visit (INDEPENDENT_AMBULATORY_CARE_PROVIDER_SITE_OTHER)
Admit: 2021-08-16 | Discharge: 2021-08-16 | Disposition: A | Discharge status: Home / Self Care | Payer: Medicaid Other | Attending: Emergency Medicine | Admitting: Emergency Medicine

## 2021-08-16 ENCOUNTER — Encounter: Payer: Self-pay | Admitting: Emergency Medicine

## 2021-08-16 DIAGNOSIS — R0600 Dyspnea, unspecified: Secondary | ICD-10-CM | POA: Diagnosis not present

## 2021-08-16 DIAGNOSIS — M79604 Pain in right leg: Secondary | ICD-10-CM | POA: Diagnosis not present

## 2021-08-16 DIAGNOSIS — L03115 Cellulitis of right lower limb: Secondary | ICD-10-CM | POA: Diagnosis not present

## 2021-08-16 LAB — COMPREHENSIVE METABOLIC PANEL
ALT: 17 U/L (ref 0–44)
AST: 14 U/L — ABNORMAL LOW (ref 15–41)
Albumin: 3.6 g/dL (ref 3.5–5.0)
Alkaline Phosphatase: 66 U/L (ref 38–126)
Anion gap: 8 (ref 5–15)
BUN: 15 mg/dL (ref 6–20)
CO2: 27 mmol/L (ref 22–32)
Calcium: 9 mg/dL (ref 8.9–10.3)
Chloride: 100 mmol/L (ref 98–111)
Creatinine, Ser: 0.62 mg/dL (ref 0.44–1.00)
GFR, Estimated: >60 mL/min (ref >60)
Glucose, Bld: 115 mg/dL — ABNORMAL HIGH (ref 70–99)
Potassium: 3.7 mmol/L (ref 3.5–5.1)
Sodium: 135 mmol/L (ref 135–145)
Total Bilirubin: 0.4 mg/dL (ref 0.3–1.2)
Total Protein: 7.5 g/dL (ref 6.5–8.1)

## 2021-08-16 LAB — CBC WITH DIFFERENTIAL/PLATELET
Abs Immature Granulocytes: 0.08 10*3/uL — ABNORMAL HIGH (ref 0.00–0.07)
Basophils Absolute: 0.1 10*3/uL (ref 0.0–0.1)
Basophils Relative: 1 %
Eosinophils Absolute: 0.1 10*3/uL (ref 0.0–0.5)
Eosinophils Relative: 1 %
HCT: 47.9 % — ABNORMAL HIGH (ref 36.0–46.0)
Hemoglobin: 15.9 g/dL — ABNORMAL HIGH (ref 12.0–15.0)
Immature Granulocytes: 1 %
Lymphocytes Relative: 32 %
Lymphs Abs: 3.5 10*3/uL (ref 0.7–4.0)
MCH: 30.6 pg (ref 26.0–34.0)
MCHC: 33.2 g/dL (ref 30.0–36.0)
MCV: 92.1 fL (ref 80.0–100.0)
Monocytes Absolute: 1 10*3/uL (ref 0.1–1.0)
Monocytes Relative: 9 %
Neutro Abs: 6.3 10*3/uL (ref 1.7–7.7)
Neutrophils Relative %: 56 %
Platelets: 221 10*3/uL (ref 150–400)
RBC: 5.2 MIL/uL — ABNORMAL HIGH (ref 3.87–5.11)
RDW: 13.2 % (ref 11.5–15.5)
WBC: 11.1 10*3/uL — ABNORMAL HIGH (ref 4.0–10.5)
nRBC: 0 % (ref 0.0–0.2)

## 2021-08-16 IMAGING — US US EXTREM LOW VENOUS*R*
1 series · 14 of 24 positions shown · non-contrast
Comparison: None Available.

CLINICAL DATA: Right leg pain, swelling, redness

EXAM:
RIGHT LOWER EXTREMITY VENOUS DOPPLER ULTRASOUND
TECHNIQUE: Gray-scale sonography with compression, as well as color and duplex
ultrasound, were performed to evaluate the deep venous system(s)
from the level of the common femoral vein through the popliteal and
proximal calf veins.

[Series 1: us extrem low venous*right* · 0.08mm/px · 14 of 38 slices shown]
[im 1/38]
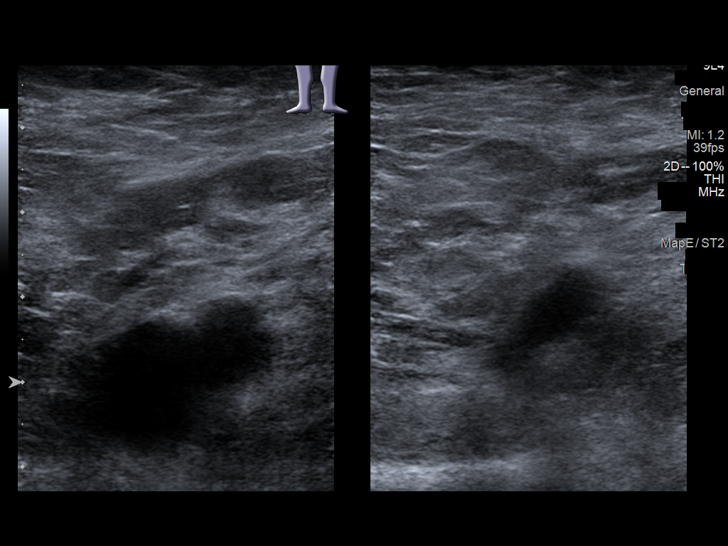
[im 4/38]
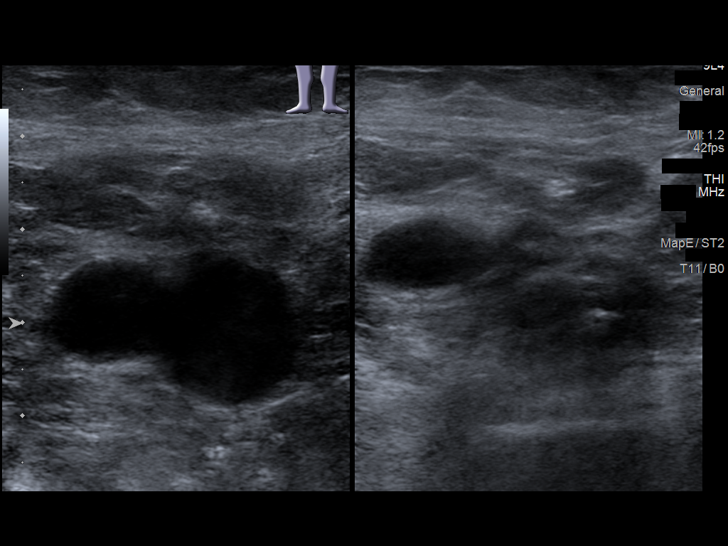
[im 7/38]
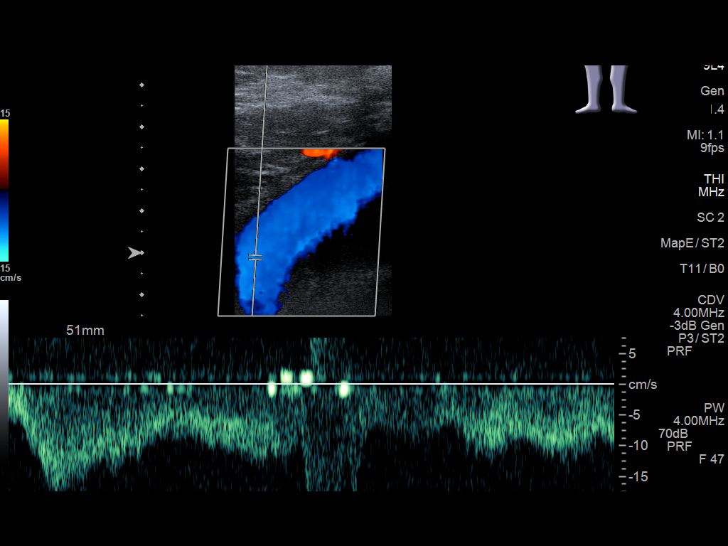
[im 10/38]
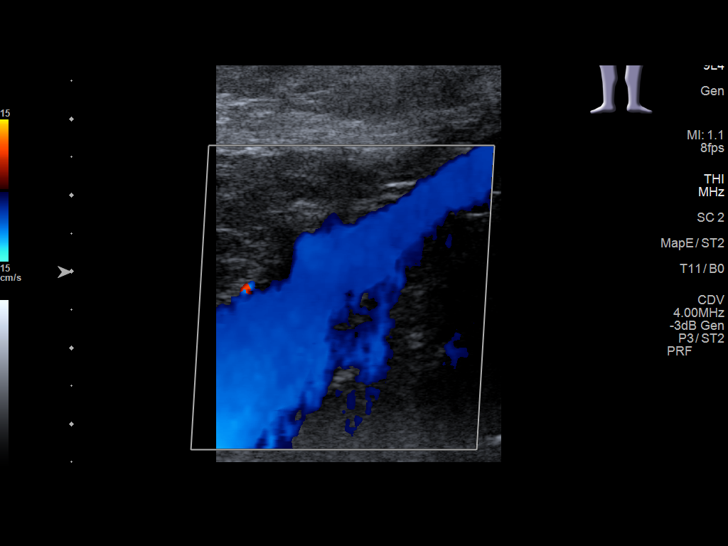
[im 12/38]
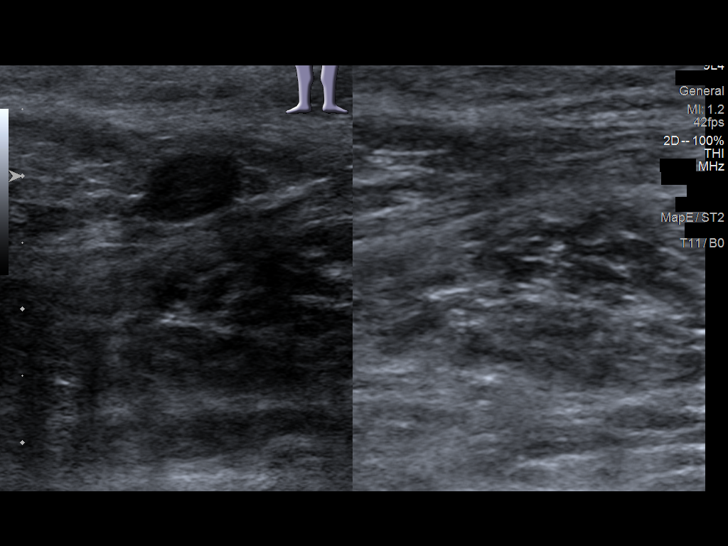
[im 15/38]
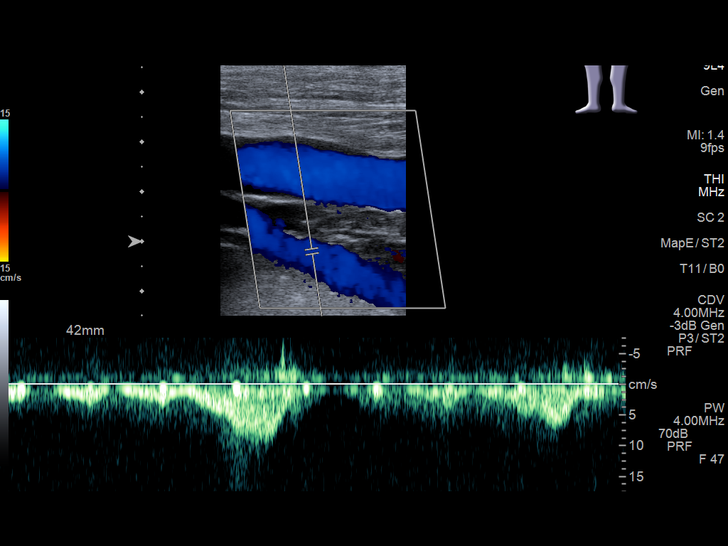
[im 18/38]
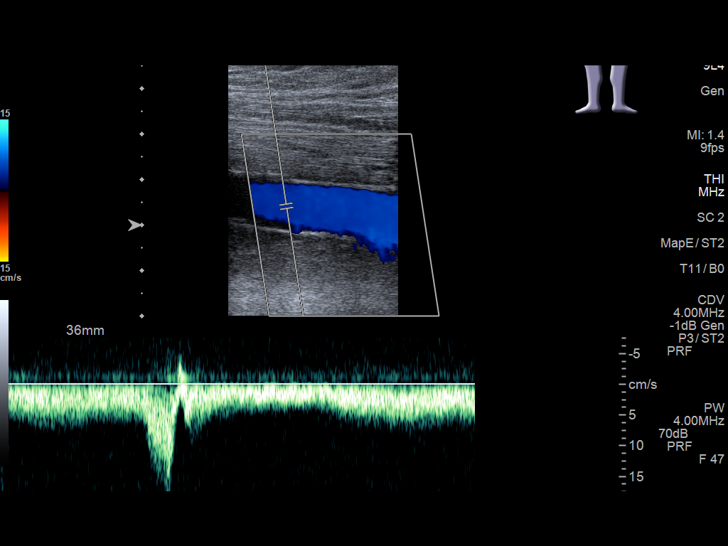
[im 20/38]
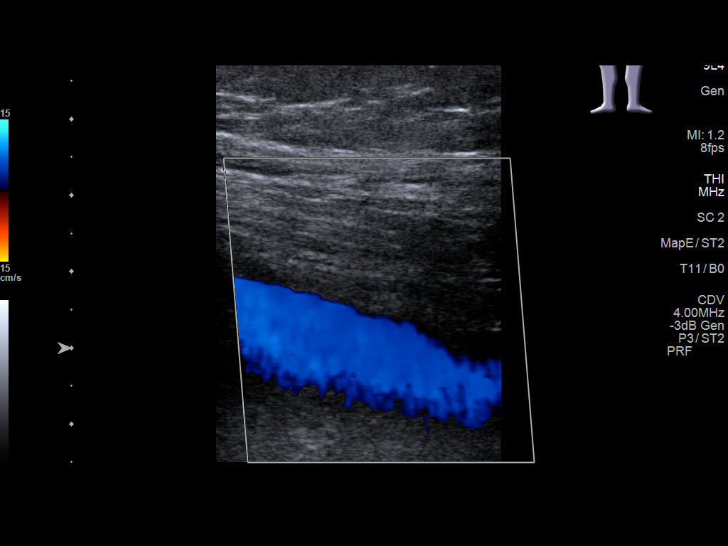
[im 23/38]
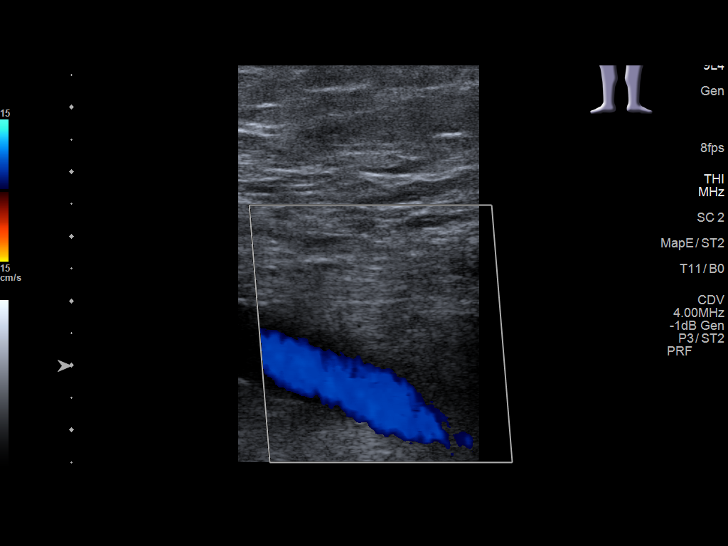
[im 26/38]
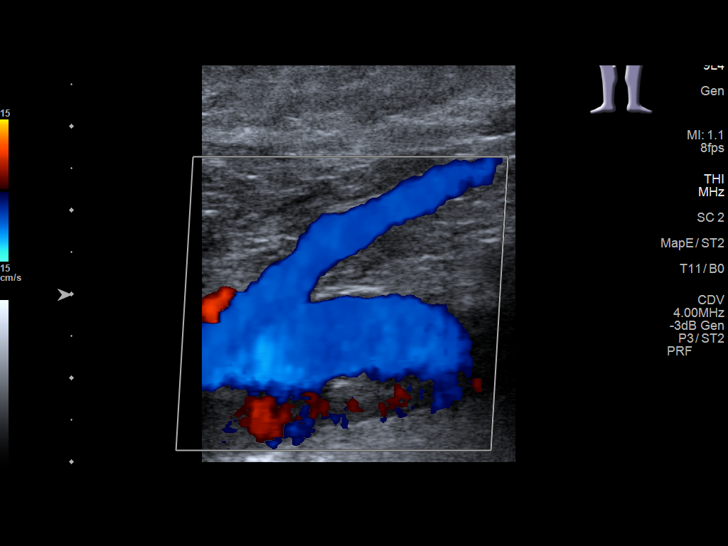
[im 29/38]
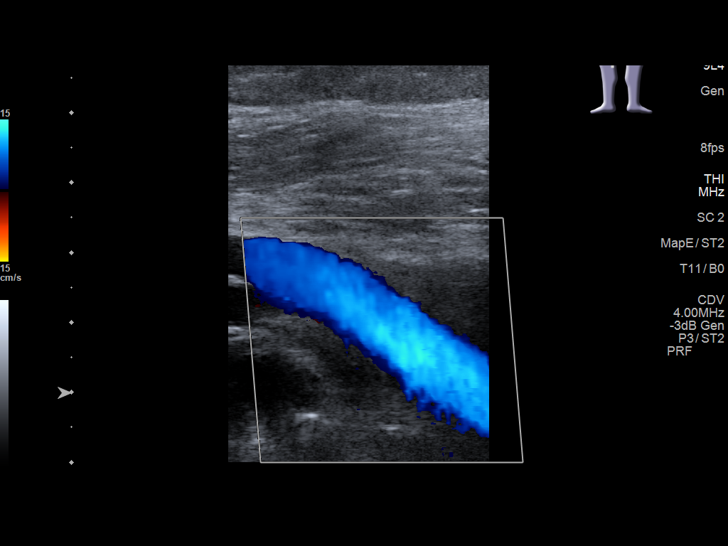
[im 31/38]
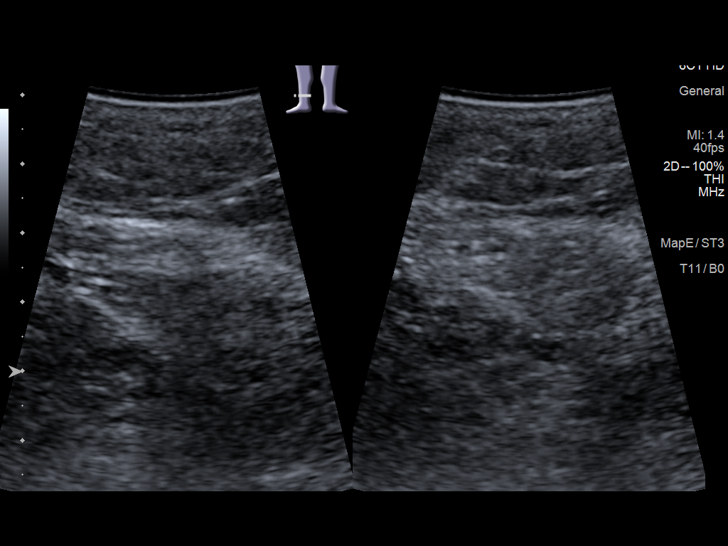
[im 34/38]
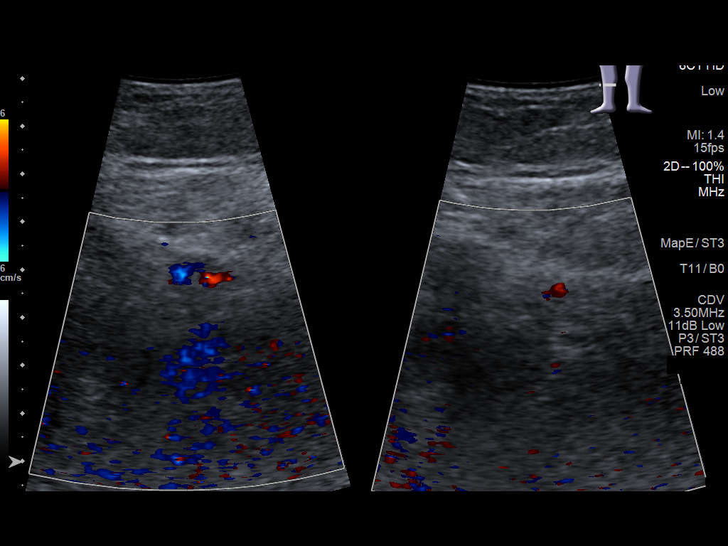
[im 38/38]
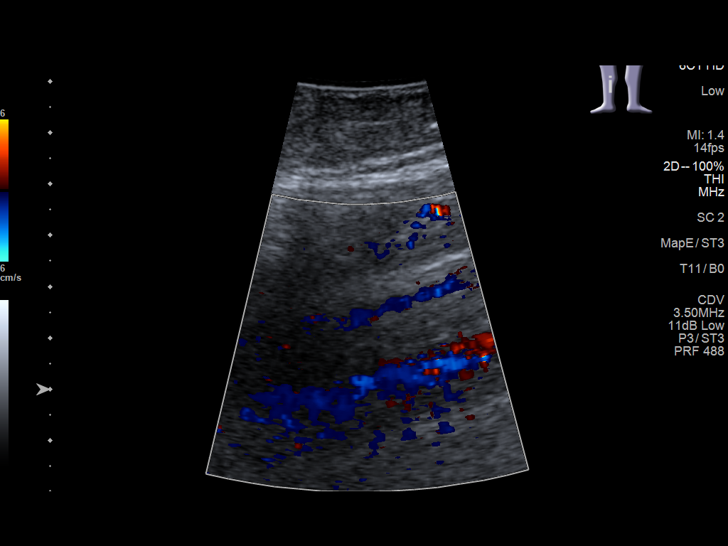

[14 of 24 positions shown; findings below may reference images not displayed]

FINDINGS: VENOUS

Normal compressibility of the common femoral, superficial femoral,
and popliteal veins, as well as the visualized calf veins.
Visualized portions of profunda femoral vein and great saphenous
vein unremarkable. No filling defects to suggest DVT on grayscale or
color Doppler imaging. Doppler waveforms show normal direction of
venous flow, normal respiratory plasticity and response to
augmentation.

Limited views of the contralateral common femoral vein are
unremarkable.

OTHER

None.

Limitations: none
IMPRESSION: Negative.

## 2021-08-16 IMAGING — CR DG CHEST 2V
4 series · 4 of 4 positions shown · non-contrast
Comparison: [DATE].

CLINICAL DATA: Dyspnea.

EXAM:
CHEST - 2 VIEW

[chest pa (1 of 2)]
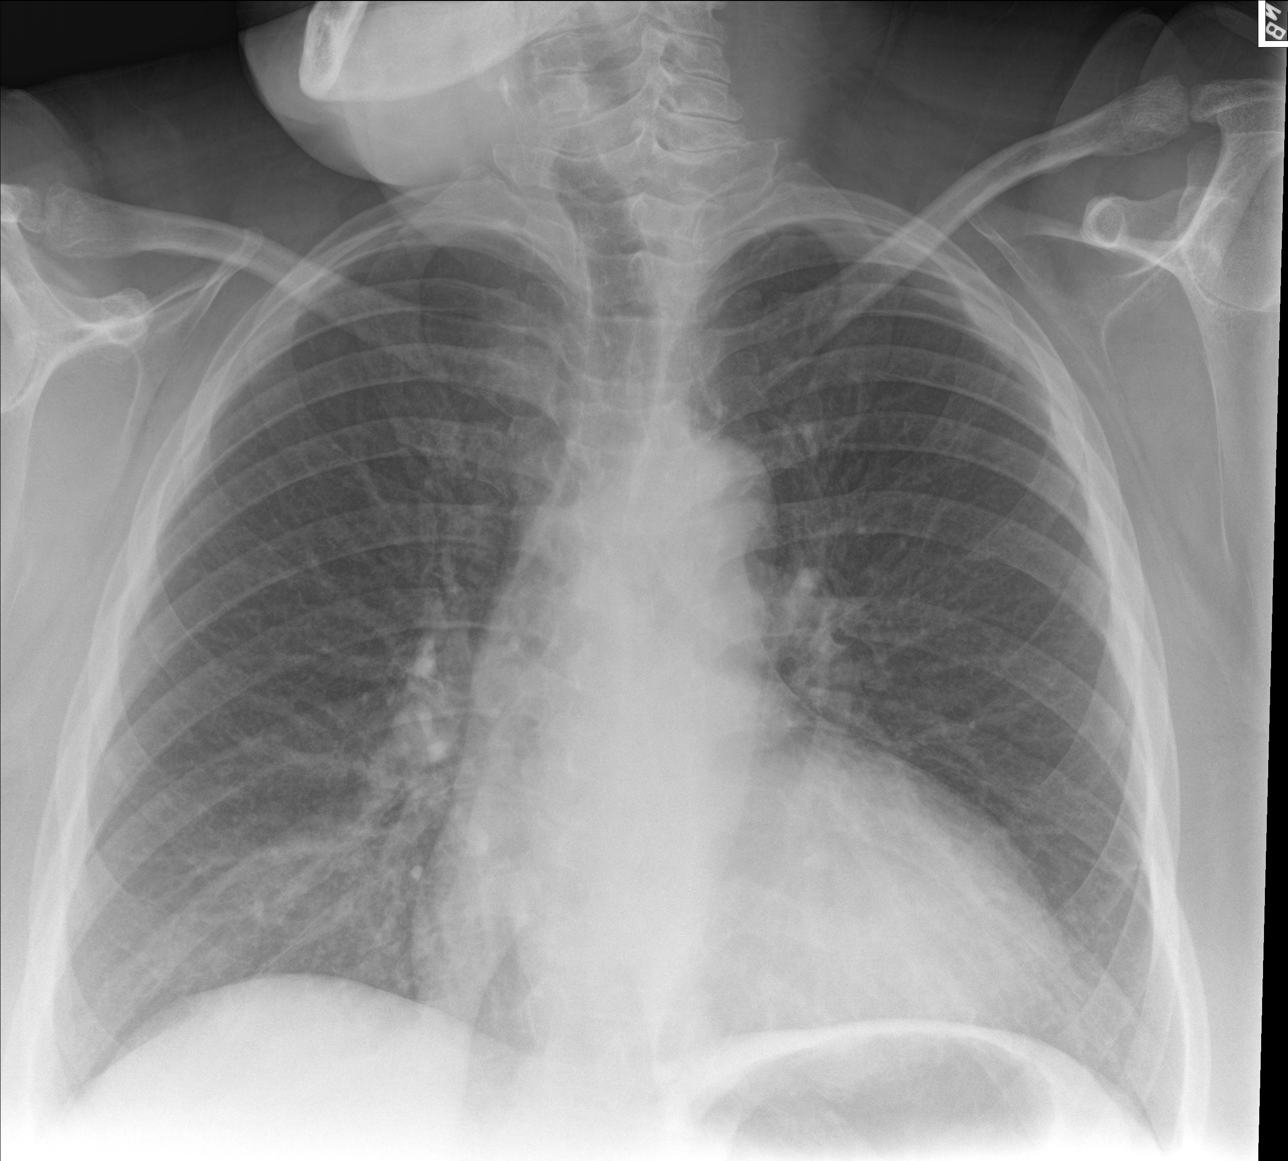

[chest lat (1 of 2)]
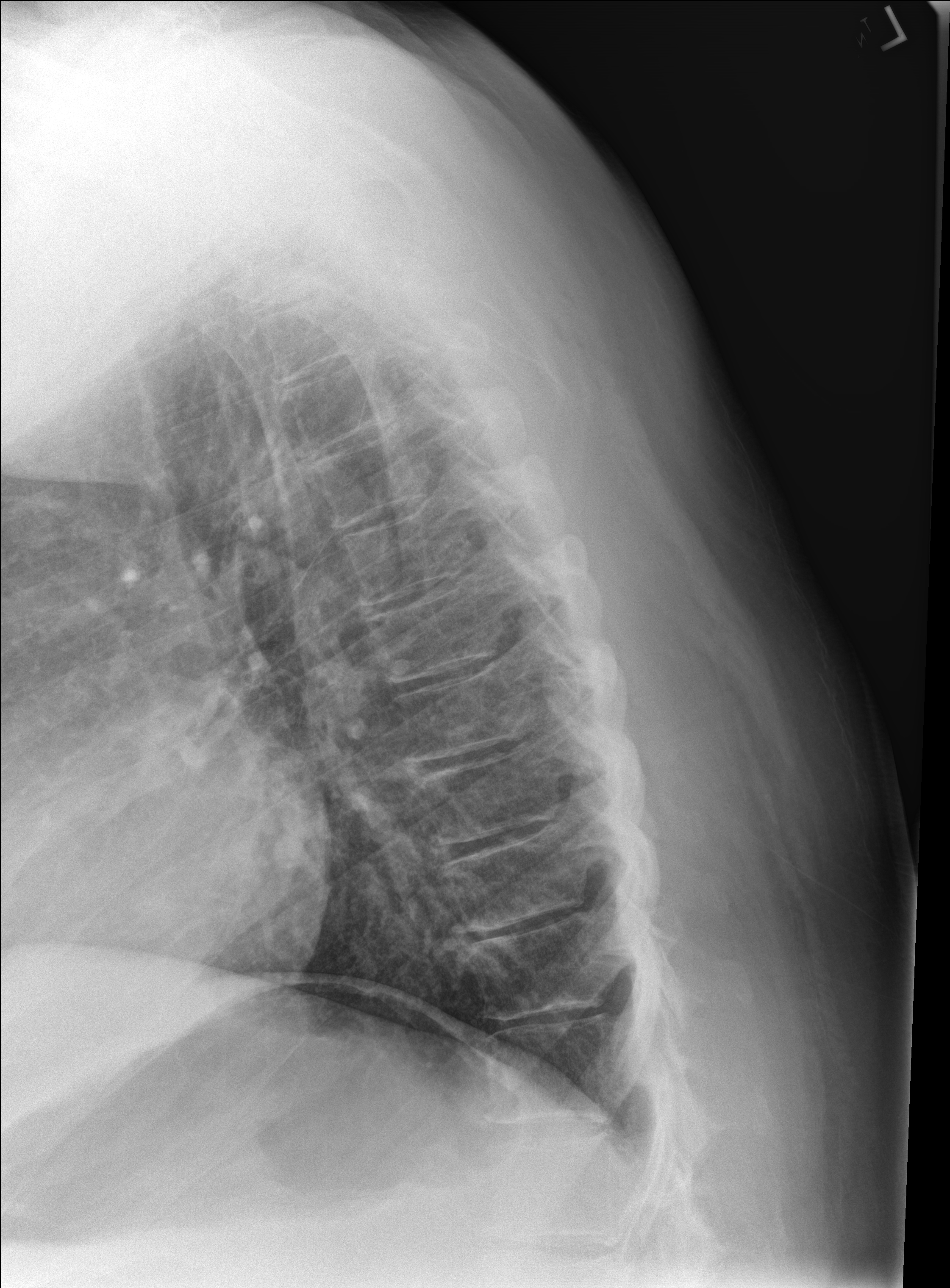

[chest pa (2 of 2)]
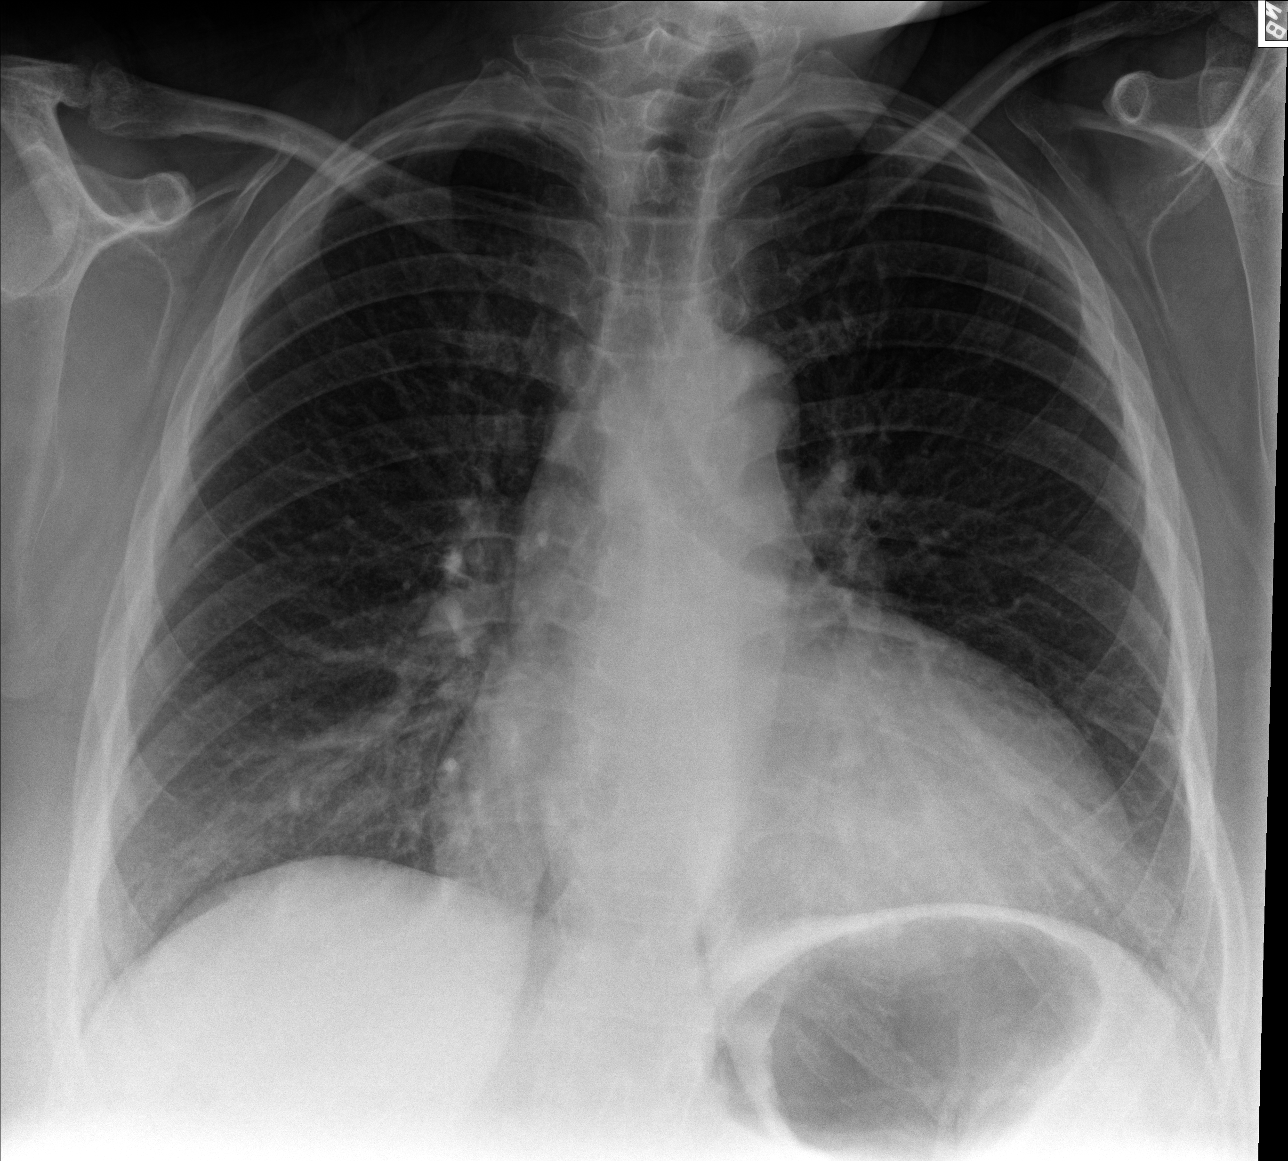

[chest lat (2 of 2)]
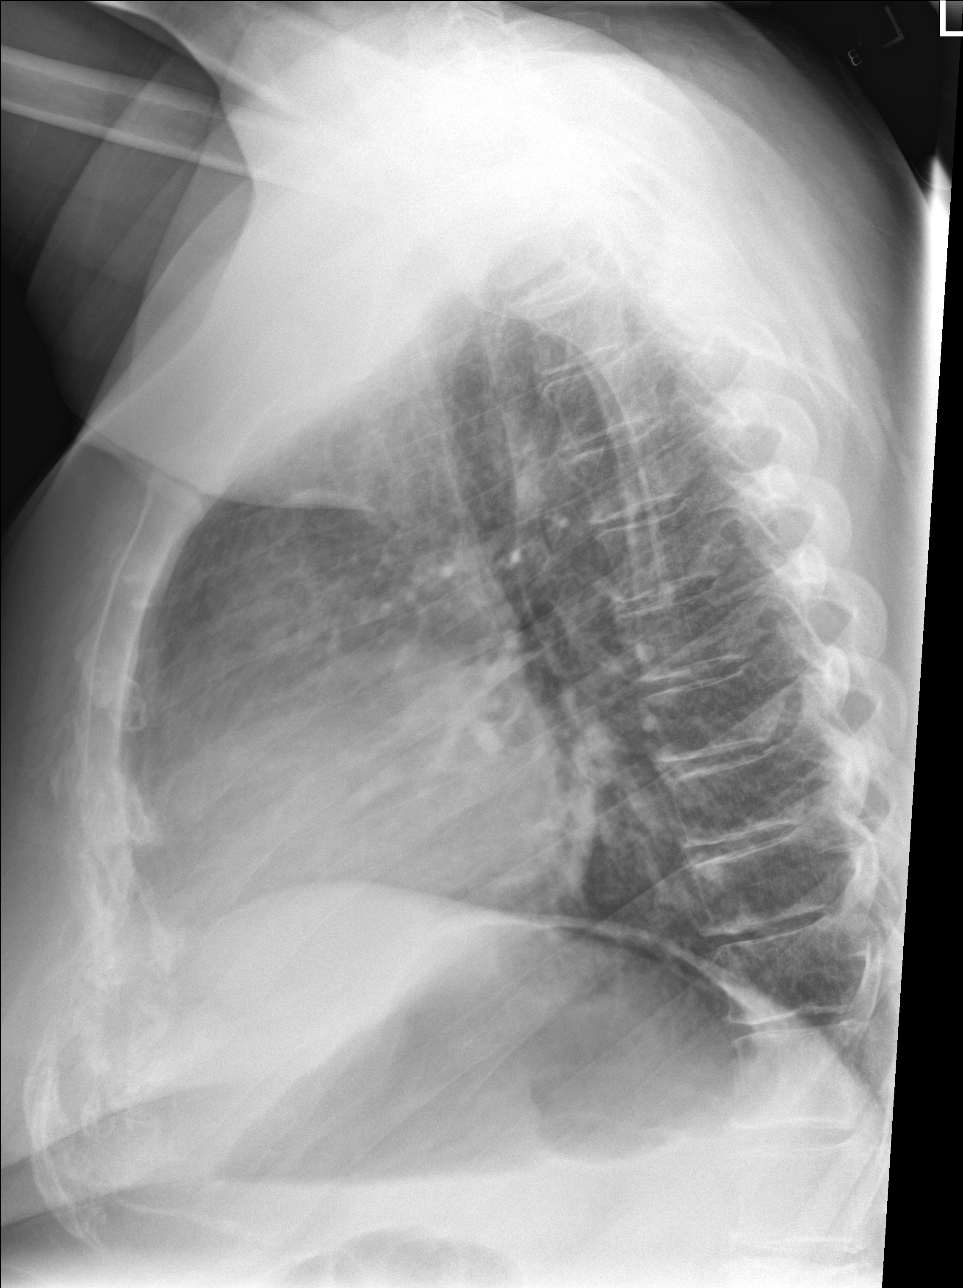

[4 of 4 positions shown; findings below may reference images not displayed]

FINDINGS: Stable cardiomediastinal silhouette. Both lungs are clear. The
visualized skeletal structures are unremarkable.
IMPRESSION: No active cardiopulmonary disease.

## 2021-08-16 MED ORDER — DOXYCYCLINE HYCLATE 100 MG PO CAPS: 100.0000 mg | ORAL_CAPSULE | Freq: Two times a day (BID) | ORAL | 0 refills | Status: AC

## 2021-08-16 NOTE — Discharge Instructions (Addendum)
Your chest x-ray did not demonstrate any evidence of pneumonia and your ultrasound of your lower leg did not demonstrate any evidence of a blood clot.  Your blood work does show that you have a mildly increased white blood cell count which is indicative of an infection.  I am been treated for cellulitis in your right lower extremity.  Take the Doxycycline twice daily with food for 10 days.  Doxycycline will make you more sensitive to sunburn so wear sunscreen when outdoors and reapply it every 90 minutes.  Keep the wound on your right great toe clean and dry.  Cover with a Band-Aid while it is draining and then leave it open to air once a scab is formed..  Use OTC Tylenol and Ibuprofen according to the package instructions as needed for pain.  Return for new or worsening symptoms.

## 2021-08-16 NOTE — ED Provider Notes (Signed)
[facility]    CSN: [id number] Arrival date & time: 08/16/21  1235      History   Chief Complaint Chief Complaint  Patient presents with   Leg Swelling   Fever   Shortness of Breath    HPI Whitney Martinez is a 51 y.o. female.   HPI  51 year old female here for evaluation of multiple complaints.  Patient reports that she has been experiencing redness and swelling to her right lower extremity that is been present for last 3 days.  She states that it started in her right great toe where she did receive to scrape and has continued to spread to the top of her foot and then this morning it was up to her mid calf.  She also spiked a fever this morning with a Tmax of 101.  She took ibuprofen and her temperature has come down.  She is currently 98.6 in clinic.  Additionally, she states that she has had some shortness of breath and feels that there is a tight band around her chest.  She has been attributing it to her spinal stenosis but she is not sure.  That has been also been going on for the past 3 days.  She is a smoker and she endorses having a cough that is productive for creamy sputum.  She does endorse decreased activity as well secondary to recent ureteral stent placement for kidney stone.  She does have calf pain as well.  She denies any history of blood clots, long car rides, or air travel.  She is not on any hormone replacement therapy.  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  Past Medical History:  Diagnosis Date   Hypertension    Neuropathy     There are no problems to display for this patient.   Past Surgical History:  Procedure Laterality Date   CESAREAN SECTION     DILATION AND CURETTAGE OF UTERUS     TONSILLECTOMY AND ADENOIDECTOMY      OB History   No obstetric history on file.      Home Medications    Prior to Admission medications   Medication Sig Start Date End Date Taking? Authorizing Provider  doxycycline (VIBRAMYCIN) 100 MG  capsule Take 1 capsule (100 mg total) by mouth 2 (two) times daily. 08/16/21  Yes Becky Augustayan, Adreyan Carbajal, NP  aspirin EC 81 MG tablet Take 81 mg by mouth daily.    [provider]  Buprenorphine HCl-Naloxone HCl 8-2 MG FILM Place 2.5 Film under the tongue daily.     [provider]  EPINEPHrine (EPIPEN 2-PAK) 0.3 mg/0.3 mL IJ SOAJ injection Inject 0.3 mLs (0.3 mg total) into the muscle as needed for anaphylaxis. 11/19/18   Chesley NoonJessup, Charles, MD  gabapentin (NEURONTIN) 600 MG tablet Take 600 mg by mouth 3 (three) times daily.  03/27/18   [provider]  lisinopril-hydrochlorothiazide (ZESTORETIC) 20-25 MG tablet Take 1 tablet by mouth daily.    [provider]  mirabegron ER (MYRBETRIQ) 50 MG TB24 tablet Take 1 tablet (50 mg total) by mouth daily. 05/05/21   Stoioff, Verna CzechScott C, MD  predniSONE (STERAPRED UNI-PAK 21 TAB) 10 MG (21) TBPK tablet Day 1: take 6 tablets Day 2: take 5 tablets Day 3: take 4 tablets Day 4: take 3 tablets Day 5: take 2 tablets Day 6:  Take 1 tablet 05/24/20   Doroteo GlassmanPhelps, Erin O, PA-C  PROVIGIL 200 MG tablet Take 200 mg by mouth daily. 06/04/19  [provider]  acetaZOLAMIDE (DIAMOX) 125 MG tablet Take 125-250 mg by mouth See admin instructions. Take 1 tablet ( ) by mouth twice daily for 2 weeks then increase to 2 tablets ( ) by mouth twice daily  06/19/19  [provider]  buPROPion (WELLBUTRIN SR) 150 MG 12 hr tablet Take 150 mg by mouth 2 (two) times daily.   06/19/19  [provider]  diphenhydrAMINE (BENADRYL) 25 MG tablet Take 2 tablets (50 mg total) by mouth every 6 (six) hours for 3 days. 11/19/18 06/19/19  Sharman Cheek, MD    Family History Family History  Problem Relation Age of Onset   Hypertension Mother    Kidney cancer Mother    Diabetes Father    Hypertension Father    Depression Father    Bipolar disorder Father    Breast cancer Neg Hx     Social History Social History   Tobacco Use   Smoking status:  Former    Types: Cigarettes    Quit date: 10/2018    Years since quitting: 2.8   Smokeless tobacco: Never  Vaping Use   Vaping Use: Never used  Substance Use Topics   Alcohol use: Yes    Comment: rarely   Drug use: Never     Allergies   Cefdinir and Diamox [acetazolamide]   Review of Systems Review of Systems  Constitutional:  Positive for fever.  Respiratory:  Positive for cough, chest tightness and shortness of breath.   Cardiovascular:  Positive for leg swelling. Negative for chest pain.  Skin:  Positive for color change and wound.  Hematological: Negative.   Psychiatric/Behavioral: Negative.       Physical Exam Triage Vital Signs ED Triage Vitals  Enc Vitals Group     BP 08/16/21 1319 (!) 153/82     Pulse Rate 08/16/21 1319 80     Resp 08/16/21 1319 18     Temp 08/16/21 1319 98.6 F (37 C)     Temp src --      SpO2 08/16/21 1319 92 %     Weight --      Height --      Head Circumference --      Peak Flow --      Pain Score 08/16/21 1318 6     Pain Loc --      Pain Edu? --      Excl. in GC? --    No data found.  Updated Vital Signs BP (!) 153/82 (BP Location: Right Arm)   Pulse 80   Temp 98.6 F (37 C)   Resp 18   SpO2 92%   Visual Acuity Right Eye Distance:   Left Eye Distance:   Bilateral Distance:    Right Eye Near:   Left Eye Near:    Bilateral Near:     Physical Exam Vitals and nursing note reviewed.  Constitutional:      General: She is not in acute distress.    Appearance: Normal appearance. She is obese. She is not ill-appearing.  HENT:     Head: Normocephalic and atraumatic.  Cardiovascular:     Rate and Rhythm: Normal rate and regular rhythm.     Pulses: Normal pulses.     Heart sounds: Normal heart sounds. No murmur heard.    No friction rub. No gallop.  Pulmonary:     Effort: Pulmonary effort is normal.     Breath sounds: Normal breath sounds. No wheezing, rhonchi or rales.  Musculoskeletal:  General: Swelling,  tenderness and signs of injury present. No deformity. Normal range of motion.  Skin:    General: Skin is warm and dry.     Capillary Refill: Capillary refill takes less than 2 seconds.     Findings: Erythema present.  Neurological:     General: No focal deficit present.     Mental Status: She is alert and oriented to person, place, and time.     Motor: No weakness.  Psychiatric:        Mood and Affect: Mood normal.        Behavior: Behavior normal.        Thought Content: Thought content normal.        Judgment: Judgment normal.      UC Treatments / Results  Labs (all labs ordered are listed, but only abnormal results are displayed) Labs Reviewed  CBC WITH DIFFERENTIAL/PLATELET - Abnormal; Notable for the following components:      Result Value   WBC 11.1 (*)    RBC 5.20 (*)    Hemoglobin 15.9 (*)    HCT 47.9 (*)    Abs Immature Granulocytes 0.08 (*)    All other components within normal limits  COMPREHENSIVE METABOLIC PANEL - Abnormal; Notable for the following components:   Glucose, Bld 115 (*)    AST 14 (*)    All other components within normal limits    EKG Sinus rhythm with a ventricular rate of 65 bpm Parable 166 ms QRS duration 110 ms QT/QTc 424/440 ms No ST or T wave abnormalities noted.  There are occasional PVCs. No significant change when compared to 11/19/2018   Radiology US Venous Img Lower Unilateral Right  Result Date: 08/16/2021 CLINICAL DATA:  Right leg pain, swelling, redness EXAM: RIGHT LOWER EXTREMITY VENOUS DOPPLER ULTRASOUND TECHNIQUE: Gray-scale sonography with compression, as well as color and duplex ultrasound, were performed to evaluate the deep venous system(s) from the level of the common femoral vein through the popliteal and proximal calf veins. COMPARISON:  None Available. FINDINGS: VENOUS Normal compressibility of the common femoral, superficial femoral, and popliteal veins, as well as the visualized calf veins. Visualized portions of  profunda femoral vein and great saphenous vein unremarkable. No filling defects to suggest DVT on grayscale or color Doppler imaging. Doppler waveforms show normal direction of venous flow, normal respiratory plasticity and response to augmentation. Limited views of the contralateral common femoral vein are unremarkable. OTHER None. Limitations: none IMPRESSION: Negative. Electronically Signed   By: Wiliam Ke M.D.   On: 08/16/2021 14:34   DG Chest 2 View  Result Date: 08/16/2021 CLINICAL DATA:  Dyspnea. EXAM: CHEST - 2 VIEW COMPARISON:  November 19, 2018. FINDINGS: Stable cardiomediastinal silhouette. Both lungs are clear. The visualized skeletal structures are unremarkable. IMPRESSION: No active cardiopulmonary disease. Electronically Signed   By: Lupita Raider M.D.   On: 08/16/2021 13:57    Procedures Procedures (including critical care time)  Medications Ordered in UC Medications - No data to display  Initial Impression / Assessment and Plan / UC Course  I have reviewed the triage vital signs and the nursing notes.  Pertinent labs & imaging results that were available during my care of the patient were reviewed by me and considered in my medical decision making (see chart for details).  Patient is a nontoxic-appearing 51 year old female here for evaluation of right lower extremity swelling, fever, and shortness of breath that has been ongoing for the past 3 days.  The fever  just started this morning.  Patient reports that her Tmax this morning was 101.  As described in HPI above, the redness and swelling to her right lower extremity started in her big toe.  She has a scrape on the medial edge of the cuticle of the right great toe that is open and continues to drain.  The redness extends from the toe up to the midfoot where she has swelling, through the ankle, and up to the middle of the right lower extremity.  In the middle of the right lower extremity there is a band of erythema that is  hot to touch.  There is no pitting to the edema.  DP and PT pulses are 2+.  Cap refill is less than 2 seconds.  Patient does not have any tenderness in her calf and she has a negative Homans' sign.  Her right calf measures 48 cm and her left calf measures 50 cm.  Cardiopulmonary exam reveals S1-S2 heart sounds with regular rate and rhythm and lung sounds that are clear to auscultation in all fields.  Patient does not have a history of diabetes.  She is a sometimes smoker and reports that her last cigarette was 2 days ago.  She does not have any long car rides or air travel.  No history of blood clots.  Though given has been more sedentary secondary to a right kidney stone and ureteral stent placement, obesity, and her smoking history there is definite concern for possible DVT on the right.  I will obtain a ultrasound of the right lower extremity to look for a DVT.  I will also do a chest x-ray to look for any cardiopulmonary abnormalities, CBC, CMP, and EKG.  Chest x-ray independently reviewed and evaluated by me.  Impression: Patient's costophrenic angles are crisp.  Her lung fields are well pneumatized.  No definitive infiltrate or effusion noted.  Radiology overread is pending. Radiology impression states stable cardiomediastinal silhouette.  Both lungs are clear.  No active cardiopulmonary disease.  EKG shows sinus rhythm with occasional PVCs but no ST or T wave abnormalities.  Largely unchanged when compared to EKG from 11/19/2018.  Radiology findings of right lower extremity ultrasound states there is normal compressibility of common femoral and superficial femoral veins as well as popliteal veins.  All visible Veins are also compressible.  No filling defect to suggest DVT on grayscale or color Doppler.  Negative exam.  CBC shows a mildly elevated WBC count of 11.1.  RBCs are increased at 5.2 and H&H is 15.9 and 47.9.  Polycythemia is most likely secondary to her being a steroid smoker.  Remainder of  her CBC is unremarkable.  Platelets are 221.  CMP shows a mildly elevated glucose of 115 and AST is mildly decreased at 14.  Remainder of her electrolytes, renal function, transaminases are unremarkable.  I will discharge patient home with a diagnosis of cellulitis and placed her on doxycycline twice daily for 10 days.  She can continue to take Tylenol and ibuprofen as needed for fever and pain.   Final Clinical Impressions(s) / UC Diagnoses   Final diagnoses:  Cellulitis of right lower extremity     Discharge Instructions      Your chest x-ray did not demonstrate any evidence of pneumonia and your ultrasound of your lower leg did not demonstrate any evidence of a blood clot.  Your blood work does show that you have a mildly increased white blood cell count which is indicative of an infection.  I am been treated for cellulitis in your right lower extremity.  Take the Doxycycline twice daily with food for 10 days.  Doxycycline will make you more sensitive to sunburn so wear sunscreen when outdoors and reapply it every 90 minutes.  Keep the wound on your right great toe clean and dry.  Cover with a Band-Aid while it is draining and then leave it open to air once a scab is formed..  Use OTC Tylenol and Ibuprofen according to the package instructions as needed for pain.  Return for new or worsening symptoms.       ED Prescriptions     Medication Sig Dispense Auth. Provider   doxycycline (VIBRAMYCIN) 100 MG capsule Take 1 capsule (100 mg total) by mouth 2 (two) times daily. 20 capsule Becky Augusta, NP      PDMP not reviewed this encounter.   Becky Augusta, NP 08/16/21 [phone]

## 2021-08-16 NOTE — ED Triage Notes (Addendum)
Pt reports lower right leg swelling and pain since Saturday. Pt states the swelling started in the toe from a scape and swelling and redness has started spreading up towards the calf. Hx of cellulitis with MRSA. Noticed a fever this am. Took ibuprofen and fever reduced to 99 F. Also reports SOB x 3 days. States SOB is worse when trying to stand for walk.

## 2022-01-21 ENCOUNTER — Ambulatory Visit: Payer: Medicaid Other

## 2022-01-21 ENCOUNTER — Ambulatory Visit
Admission: RE | Admit: 2022-01-21 | Discharge: 2022-01-21 | Disposition: A | Discharge status: Home / Self Care | Payer: Medicaid Other | Source: Ambulatory Visit | Attending: Family Medicine | Admitting: Family Medicine

## 2022-01-21 ENCOUNTER — Ambulatory Visit (INDEPENDENT_AMBULATORY_CARE_PROVIDER_SITE_OTHER): Payer: Medicaid Other

## 2022-01-21 VITALS — BP 160/88 | HR 75 | Temp 98.0°F | Resp 18 | Ht 69.0 in | Wt 330.0 lb

## 2022-01-21 DIAGNOSIS — R059 Cough, unspecified: Secondary | ICD-10-CM | POA: Diagnosis not present

## 2022-01-21 DIAGNOSIS — N10 Acute tubulo-interstitial nephritis: Secondary | ICD-10-CM | POA: Diagnosis not present

## 2022-01-21 LAB — URINALYSIS, ROUTINE W REFLEX MICROSCOPIC
Bilirubin Urine: NEGATIVE
Glucose, UA: NEGATIVE mg/dL
Ketones, ur: NEGATIVE mg/dL
Nitrite: POSITIVE — AB
Protein, ur: 30 mg/dL — AB
Specific Gravity, Urine: 1.02 (ref 1.005–1.030)
pH: 7 (ref 5.0–8.0)

## 2022-01-21 LAB — URINALYSIS, MICROSCOPIC (REFLEX)

## 2022-01-21 MED ORDER — PREDNISONE 10 MG (21) PO TBPK: ORAL_TABLET | ORAL | 0 refills | Status: AC

## 2022-01-21 MED ORDER — CEPHALEXIN 500 MG PO CAPS: 500.0000 mg | ORAL_CAPSULE | Freq: Four times a day (QID) | ORAL | 0 refills | Status: AC

## 2022-01-21 NOTE — Discharge Instructions (Addendum)
Your urine was concerning for a urinary tract infection.  As you also have a fever you may have pyelonephritis and we will treat your urinary tract infection for longer.  We also gave you steroids to help clear up your cough.    Go to ED for red flag symptoms, including; fevers you cannot reduce with Tylenol/Motrin, severe headaches, vision changes, numbness/weakness in part of the body, lethargy, confusion, intractable vomiting, severe dehydration, chest pain, breathing difficulty, severe persistent abdominal or pelvic pain, signs of severe infection (increased redness, swelling of an area), feeling faint or passing out, dizziness, etc. You should especially go to the ED for sudden acute worsening of condition if you do not elect to go at this time.

## 2022-01-21 NOTE — ED Provider Notes (Signed)
[facility]    CSN: [id number] Arrival date & time: 01/21/22  1408      History   Chief Complaint Chief Complaint  Patient presents with   Cough    Severe cough,  nasal congestion, and fatigue x1 week Sob   Urinary Frequency    Urinary frequency, urinary urgency, foul urine odor. X1 week    HPI Whitney Martinez is a 51 y.o. female.   HPI   Whitney Martinez presents for cough for a week. States she pulled something last night while coughing. She has not been able to get any rest due to the coughting. Has lots of congestion. She took Claritin, Flonase and Mucinex. She is having some shortness of breath with exertion. She has psoraritic arthrits. She gets winded from pain with her arthritis but feels like can't catch her breathe. She states she was a heavy smoker but quit a year ago.   She had a 19 mm kidney stone removed surgically. She had a stent placed.  She has dysuria and foul smelling urine for the past 8-10 days ago. No blood in her urine.  She had a fever a couple days ago. T max 101F.Marland Kitchen Had some nausea but no nausea.      Past Medical History:  Diagnosis Date   Hypertension    Neuropathy     There are no problems to display for this patient.   Past Surgical History:  Procedure Laterality Date   CESAREAN SECTION     DILATION AND CURETTAGE OF UTERUS     TONSILLECTOMY AND ADENOIDECTOMY      OB History   No obstetric history on file.      Home Medications    Prior to Admission medications   Medication Sig Start Date End Date Taking? Authorizing Provider  aspirin EC 81 MG tablet Take 81 mg by mouth daily.   Yes [provider]  Buprenorphine HCl-Naloxone HCl 8-2 MG FILM Place 2.5 Film under the tongue daily.    Yes [provider]  doxycycline (VIBRAMYCIN) 100 MG capsule Take 1 capsule (100 mg total) by mouth 2 (two) times daily. 08/16/21  Yes Margarette Canada, NP  EPINEPHrine (EPIPEN 2-PAK) 0.3 mg/0.3 mL IJ SOAJ injection Inject 0.3 mLs (0.3  mg total) into the muscle as needed for anaphylaxis. 11/19/18  Yes Blake Divine, MD  gabapentin (NEURONTIN) 600 MG tablet Take 600 mg by mouth 3 (three) times daily.  03/27/18  Yes [provider]  lisinopril-hydrochlorothiazide (ZESTORETIC) 20-25 MG tablet Take 1 tablet by mouth daily.   Yes [provider]  mirabegron ER (MYRBETRIQ) 50 MG TB24 tablet Take 1 tablet (50 mg total) by mouth daily. 05/05/21  Yes Stoioff, Ronda Fairly, MD  predniSONE (STERAPRED UNI-PAK 21 TAB) 10 MG (21) TBPK tablet Day 1: take 6 tablets Day 2: take 5 tablets Day 3: take 4 tablets Day 4: take 3 tablets Day 5: take 2 tablets Day 6:  Take 1 tablet 05/24/20  Yes Phelps, Erin O, PA-C  PROVIGIL 200 MG tablet Take 200 mg by mouth daily. 06/04/19  Yes [provider]  acetaZOLAMIDE (DIAMOX) 125 MG tablet Take 125-250 mg by mouth See admin instructions. Take 1 tablet (125mg ) by mouth twice daily for 2 weeks then increase to 2 tablets (250mg ) by mouth twice daily  06/19/19  [provider]  buPROPion (WELLBUTRIN SR) 150 MG 12 hr tablet Take 150 mg by mouth 2 (two) times daily.   06/19/19  [provider]  diphenhydrAMINE (BENADRYL) 25 MG tablet Take 2 tablets (50 mg total) by mouth every 6 (six) hours for 3 days. 11/19/18 06/19/19  Sharman Cheek, MD    Family History Family History  Problem Relation Age of Onset   Hypertension Mother    Kidney cancer Mother    Diabetes Father    Hypertension Father    Depression Father    Bipolar disorder Father    Breast cancer Neg Hx     Social History Social History   Tobacco Use   Smoking status: Former    Types: Cigarettes    Quit date: 10/2018    Years since quitting: 3.2   Smokeless tobacco: Never  Vaping Use   Vaping Use: Never used  Substance Use Topics   Alcohol use: Not Currently    Comment: rarely   Drug use: Never     Allergies   Cefdinir and Diamox [acetazolamide]   Review of Systems Review of Systems: negative  unless otherwise stated in HPI.      Physical Exam Triage Vital Signs ED Triage Vitals  Enc Vitals Group     BP 01/21/22 1429 (!) 160/88     Pulse Rate 01/21/22 1429 75     Resp 01/21/22 1429 18     Temp 01/21/22 1429 98 F (36.7 C)     Temp Source 01/21/22 1429 Oral     SpO2 01/21/22 1429 93 %     Weight 01/21/22 1427 (!) 330 lb (149.7 kg)     Height 01/21/22 1427 5\' 9"  (1.753 m)     Head Circumference --      Peak Flow --      Pain Score 01/21/22 1427 0     Pain Loc --      Pain Edu? --      Excl. in GC? --    No data found.  Updated Vital Signs BP (!) 160/88 (BP Location: Left Arm)   Pulse 75   Temp 98 F (36.7 C) (Oral)   Resp 18   Ht 5\' 9"  (1.753 m)   Wt (!) 149.7 kg   SpO2 93%   BMI 48.73 kg/m   Visual Acuity Right Eye Distance:   Left Eye Distance:   Bilateral Distance:    Right Eye Near:   Left Eye Near:    Bilateral Near:     Physical Exam GEN:     alert, non-toxic appearing female in no distress ***   HENT:  mucus membranes moist, oropharyngeal ***without lesions or ***exudate, no*** tonsillar hypertrophy, *** mild oropharyngeal erythema , *** moderate erythematous edematous turbinates, ***clear nasal discharge, ***bilateral TM ***normal EYES:   pupils equal and reactive, EOMi***, ***no scleral injection NECK:  normal ROM, no ***lymphadenopathy, ***no meningismus   RESP:  no increased work of breathing, ***clear to auscultation bilaterally CVS:   regular rate ***and rhythm Skin:   warm and dry, no rash on visible skin***, normal*** skin turgor    UC Treatments / Results  Labs (all labs ordered are listed, but only abnormal results are displayed) Labs Reviewed  URINALYSIS, ROUTINE W REFLEX MICROSCOPIC    EKG   Radiology No results found.  Procedures Procedures (including critical care time)  Medications Ordered in UC Medications - No data to display  Initial Impression / Assessment and Plan / UC Course  I have reviewed the triage  vital signs and the nursing notes.  Pertinent labs & imaging results that were available during my care of the patient  were reviewed by me and considered in my medical decision making (see chart for details).       Pt is a 51 y.o. female who presents for *** days of respiratory symptoms. Skylar is ***afebrile here without recent antipyretics. Satting well on room air. Overall pt is ***well appearing, well hydrated, without respiratory distress. Pulmonary exam ***is unremarkable.  COVID ***and influenza testing obtained. ***Pt to quarantine until COVID test results or longer if positive.  I will call patient with test results, if positive. History consistent with ***viral respiratory illness. Discussed symptomatic treatment.  Explained lack of efficacy of antibiotics in viral disease.  Typical duration of symptoms discussed. ***Return and ED precautions given and patient/parent*** voiced understanding. - continue age-appropriate Tylenol/ ***Motrin as needed for discomfort/fever - nasal saline to help with his nasal congestion - Use a mist humidifier to help with breathing - Stressed importance of hydration  - Refilled ***allergy medication/ Flonase - Albuterol to help with chest tightness ***  - Zofran for nausea *** - Work/***School note provided, per pt request  - Discussed return and ED precautions, understanding voiced.   Discussed MDM, treatment plan and plan for follow-up with patient/parent*** who agrees with plan.     Final Clinical Impressions(s) / UC Diagnoses   Final diagnoses:  None   Discharge Instructions   None    ED Prescriptions   None    PDMP not reviewed this encounter.

## 2022-01-21 NOTE — ED Triage Notes (Signed)
Pt states that she has a cough, nasal congestion, fatigue and sob. X1 week  Pt states that she also has some urinary frequency, urinary urgency and foul odor of urine. X1 week

## 2022-01-22 LAB — URINE CULTURE

## 2022-07-03 ENCOUNTER — Ambulatory Visit: Payer: Medicaid Other | Attending: Otolaryngology

## 2022-07-03 DIAGNOSIS — G4733 Obstructive sleep apnea (adult) (pediatric): Secondary | ICD-10-CM | POA: Insufficient documentation

## 2022-07-03 DIAGNOSIS — R400 Somnolence: Secondary | ICD-10-CM | POA: Insufficient documentation

## 2022-07-03 DIAGNOSIS — I10 Essential (primary) hypertension: Secondary | ICD-10-CM | POA: Insufficient documentation

## 2022-07-21 ENCOUNTER — Other Ambulatory Visit: Payer: Self-pay | Admitting: Family Medicine

## 2022-07-21 ENCOUNTER — Ambulatory Visit
Admission: RE | Admit: 2022-07-21 | Discharge: 2022-07-21 | Disposition: A | Discharge status: Home / Self Care | Payer: Medicaid Other | Source: Ambulatory Visit | Attending: Family Medicine | Admitting: Family Medicine

## 2022-07-21 DIAGNOSIS — R0602 Shortness of breath: Secondary | ICD-10-CM

## 2022-09-15 ENCOUNTER — Other Ambulatory Visit: Payer: Self-pay | Admitting: Pulmonary Disease

## 2022-09-15 DIAGNOSIS — G4734 Idiopathic sleep related nonobstructive alveolar hypoventilation: Secondary | ICD-10-CM

## 2022-09-15 DIAGNOSIS — R0989 Other specified symptoms and signs involving the circulatory and respiratory systems: Secondary | ICD-10-CM

## 2022-09-19 ENCOUNTER — Ambulatory Visit: Payer: Medicaid Other

## 2023-01-15 ENCOUNTER — Ambulatory Visit: Payer: Medicaid Other | Admitting: Urology

## 2023-01-18 ENCOUNTER — Ambulatory Visit: Payer: Medicaid Other | Admitting: Urology

## 2023-10-04 ENCOUNTER — Other Ambulatory Visit: Payer: Self-pay | Admitting: Medical Genetics

## 2024-01-01 ENCOUNTER — Other Ambulatory Visit: Payer: Self-pay | Admitting: Medical Genetics

## 2024-01-01 DIAGNOSIS — Z006 Encounter for examination for normal comparison and control in clinical research program: Secondary | ICD-10-CM
